# Patient Record
Sex: Female | Born: 1940 | Race: White | Hispanic: No | Smoking: Never smoker
Health system: Southern US, Community
[De-identification: ages and names within clinical notes are randomized; demographics above are authoritative.]

## PROBLEM LIST (undated history)

## (undated) DIAGNOSIS — M109 Gout, unspecified: Secondary | ICD-10-CM

## (undated) DIAGNOSIS — I10 Essential (primary) hypertension: Secondary | ICD-10-CM

## (undated) DIAGNOSIS — M545 Low back pain, unspecified: Secondary | ICD-10-CM

## (undated) DIAGNOSIS — T4145XA Adverse effect of unspecified anesthetic, initial encounter: Secondary | ICD-10-CM

## (undated) DIAGNOSIS — T8859XA Other complications of anesthesia, initial encounter: Secondary | ICD-10-CM

## (undated) DIAGNOSIS — E039 Hypothyroidism, unspecified: Secondary | ICD-10-CM

## (undated) DIAGNOSIS — M1990 Unspecified osteoarthritis, unspecified site: Secondary | ICD-10-CM

## (undated) DIAGNOSIS — G8929 Other chronic pain: Secondary | ICD-10-CM

## (undated) DIAGNOSIS — R7989 Other specified abnormal findings of blood chemistry: Secondary | ICD-10-CM

## (undated) DIAGNOSIS — R569 Unspecified convulsions: Secondary | ICD-10-CM

## (undated) DIAGNOSIS — K219 Gastro-esophageal reflux disease without esophagitis: Secondary | ICD-10-CM

## (undated) DIAGNOSIS — B029 Zoster without complications: Secondary | ICD-10-CM

## (undated) DIAGNOSIS — L03031 Cellulitis of right toe: Secondary | ICD-10-CM

## (undated) DIAGNOSIS — G40909 Epilepsy, unspecified, not intractable, without status epilepticus: Secondary | ICD-10-CM

## (undated) DIAGNOSIS — R945 Abnormal results of liver function studies: Secondary | ICD-10-CM

## (undated) DIAGNOSIS — I350 Nonrheumatic aortic (valve) stenosis: Secondary | ICD-10-CM

## (undated) DIAGNOSIS — I639 Cerebral infarction, unspecified: Secondary | ICD-10-CM

## (undated) DIAGNOSIS — E559 Vitamin D deficiency, unspecified: Secondary | ICD-10-CM

## (undated) HISTORY — DX: Zoster without complications: B02.9

## (undated) HISTORY — DX: Nonrheumatic aortic (valve) stenosis: I35.0

## (undated) HISTORY — DX: Vitamin D deficiency, unspecified: E55.9

## (undated) HISTORY — DX: Cellulitis of right toe: L03.031

## (undated) HISTORY — DX: Essential (primary) hypertension: I10

## (undated) HISTORY — DX: Gastro-esophageal reflux disease without esophagitis: K21.9

## (undated) HISTORY — DX: Other specified abnormal findings of blood chemistry: R79.89

## (undated) HISTORY — DX: Hypothyroidism, unspecified: E03.9

## (undated) HISTORY — DX: Gout, unspecified: M10.9

## (undated) HISTORY — DX: Abnormal results of liver function studies: R94.5

## (undated) HISTORY — DX: Epilepsy, unspecified, not intractable, without status epilepticus: G40.909

---

## 1968-09-30 HISTORY — PX: TUBAL LIGATION: SHX77

## 2001-10-31 ENCOUNTER — Encounter: Payer: Self-pay | Admitting: Emergency Medicine

## 2001-10-31 ENCOUNTER — Emergency Department (HOSPITAL_COMMUNITY): Admission: EM | Admit: 2001-10-31 | Discharge: 2001-10-31 | Payer: Self-pay | Admitting: Emergency Medicine

## 2003-03-12 ENCOUNTER — Encounter: Payer: Self-pay | Admitting: *Deleted

## 2003-03-12 ENCOUNTER — Ambulatory Visit (HOSPITAL_COMMUNITY): Admission: RE | Admit: 2003-03-12 | Discharge: 2003-03-12 | Payer: Self-pay | Admitting: *Deleted

## 2003-10-11 ENCOUNTER — Other Ambulatory Visit: Admission: RE | Admit: 2003-10-11 | Discharge: 2003-10-11 | Payer: Self-pay | Admitting: Family Medicine

## 2003-11-29 HISTORY — PX: LAPAROSCOPIC CHOLECYSTECTOMY: SUR755

## 2004-07-03 ENCOUNTER — Inpatient Hospital Stay (HOSPITAL_COMMUNITY): Admission: EM | Admit: 2004-07-03 | Discharge: 2004-07-10 | Payer: Self-pay | Admitting: Emergency Medicine

## 2004-07-03 ENCOUNTER — Encounter (INDEPENDENT_AMBULATORY_CARE_PROVIDER_SITE_OTHER): Payer: Self-pay | Admitting: Specialist

## 2007-03-16 ENCOUNTER — Other Ambulatory Visit: Admission: RE | Admit: 2007-03-16 | Discharge: 2007-03-16 | Payer: Self-pay | Admitting: Internal Medicine

## 2009-02-28 DIAGNOSIS — B029 Zoster without complications: Secondary | ICD-10-CM

## 2009-02-28 HISTORY — DX: Zoster without complications: B02.9

## 2009-12-07 ENCOUNTER — Other Ambulatory Visit: Admission: RE | Admit: 2009-12-07 | Discharge: 2009-12-07 | Payer: Self-pay | Admitting: Internal Medicine

## 2010-10-20 ENCOUNTER — Encounter: Payer: Self-pay | Admitting: Family Medicine

## 2011-02-15 NOTE — H&P (Signed)
NAMESAMELLA, LUCCHETTI              ACCOUNT NO.:  [id number]   MEDICAL RECORD NO.:  [id number]          PATIENT TYPE:  EMS   LOCATION:  MAJO                         FACILITY:  [facility]   PHYSICIAN:  Abigail Miyamoto, M.D. DATE OF BIRTH:  01-01-1941   DATE OF ADMISSION:  07/02/2004  DATE OF DISCHARGE:                                HISTORY & PHYSICAL   CHIEF COMPLAINT:  Abdominal pain.   HISTORY:  This is a 70 year old female who I have been asked to evaluate for  abdominal pain.  The patient currently is heavily sedated and minimally  responsive from the medication she had been given in the emergency  department prior to my arrival to see her but according to the nurses she  was talking and conversive prior to the medication. Secondary to this I have  been unable to get any kind of history from the patient, and her family has  already left the emergency department and gone home.  The ER physician who  had been seeing the patient has gone home as well.  According to the notes  on the computer, the patient was having right side abdominal pain earlier  today and presented to her primary care physician and was sent here.  She  apparently has had no nausea and vomiting but then developed some after  being given oral contrast for a CAT scan.  Apparently the pain was diffuse  but mostly in the right upper quadrant.   PAST MEDICAL HISTORY:  Apparently significant for seizure disorder.   PAST SURGICAL HISTORY:  She has had some kind of lower abdominal incision.  This is a midline incision.   MEDICATIONS:  Apparently include Depakote.   ALLERGIES:  She is apparently allergic to PENICILLIN, but again I cannot say  what that allergy or reaction is.   SOCIAL HISTORY:  Unobtainable secondary to the patient's sedated state.   REVIEW OF SYSTEMS:  Unobtainable secondary to the patient's sedated state.   PHYSICAL EXAMINATION:  GENERAL:  An obese female lying in bed, minimally  responsive but does  arouse to sternal rub and will talk, and then becomes  unconscious quickly again.  VITAL SIGNS:  Temperature is 98/6, pulse is 82, blood pressure is 128/87,  respiratory rate is 28.  HEENT:  Eyes are anicteric.  LUNGS:  Clear to auscultation bilaterally, normal respiratory effort.  CARDIOVASCULAR:  Regular rate and rhythm, there is a systolic ejection  murmur.  ABDOMEN:  Soft, it is tender with guarding in the right upper quadrant,  there is a well healed lower midline incision without hernia.   LABORATORY DATA:  The patient has normal liver function test with a  bilirubin of 0.7, alkaline phosphatase 67.  White blood count is 11.2  thousand, hemoglobin is 14.0, there is no left shift. Amylase is 48.  Urinalysis shows the patient to have 11-20 white blood cell count's and 11-  20 rbc's, and many bacteria.  BUN and creatinine are 19 and 1.3, potassium  4.1.   The patient had a CAT scan of the abdomen and pelvis which shows her abdomen  is  distended, a thick walled gallbladder with a lot of paracolic cystic  inflammation; the rest of the abdomen is unremarkable CAT scan.   IMPRESSION AND PLAN:  This is a patient with possible cholecystitis. Again,  I have not been able to do an adequate physical examination and history  taking secondary to her sedated state from the medications that were ordered  by the ER physician.  At this point we will admit her to the hospital for IV  antibiotics and once she is more awake will hopefully be able to obtain a  better history and physical examination, given the CAT scan that does appear  to be consistent with cholecystitis, although I am sure how the urinalysis  relates to this. We may be proceeding with cholecystectomy today although we  may order a HIDA scan and ultrasound prior to this.       DB/MEDQ  D:  07/03/2004  T:  07/03/2004  Job:  [id number]

## 2011-02-15 NOTE — Discharge Summary (Signed)
Dana Harrington, Dana Harrington              ACCOUNT NO.:  [id number]   MEDICAL RECORD NO.:  [id number]          PATIENT TYPE:  INP   LOCATION:  5733                         FACILITY:  [facility]   PHYSICIAN:  Adolph Pollack, M.D.DATE OF BIRTH:  Dec 25, 1940   DATE OF ADMISSION:  07/02/2004  DATE OF DISCHARGE:  07/10/2004                                 DISCHARGE SUMMARY   PRINCIPAL DISCHARGE DIAGNOSIS:  Acute cholecystitis.   SECONDARY DIAGNOSES:  1.  Seizure disorder.  2.  Metabolic encephalopathy secondary to narcotics.  3.  Acute renal insufficiency secondary to acute tubular necrosis.   PROCEDURE:  Laparoscopic cholecystectomy, July 03, 2004.   REASON FOR ADMISSION:  This 70 year old female was seen by Dr. Abigail Miyamoto in the emergency department, who had right-sided abdominal pain.  She was doing some narcotics and was heavily sedated.  On examination, she  had some right upper quadrant tenderness and guarding.  She had a mild  leukocytosis.  She had a urinalysis suggesting a possible urinary tract  infection.  She subsequently was admitted.   She was admitted, placed on IV antibiotics, and taken to the operating room  where she underwent the above procedure.  Postoperatively, her seizure  medications were restarted, but she had some difficulty with a depressed  mental status since receiving narcotics in the emergency department.  She  was stable from a surgical standpoint.  I went ahead and put a PICC line in  her and asked [facility] to see her.  They suggested switching her  antibiotics to cover a possible aspiration pneumonia.  Over the subsequent  days, she very slowly improved.  She had coverage of her UTI and the  possible pneumonia.  She had a drainage tube, which was draining a small  amount of serous fluid.  By the 9th, she was more awake and alert and could  have her Foley removed.  Her drain was removed on July 09, 2004.  Her  mental status improved  dramatically, and by July 10, 2004, she was ready  for discharge.   DISPOSITION:  Discharged home in satisfactory condition, July 10, 2004.  She will follow up with Dr. Idell Pickles in one week.  After discussion with  Internal Medicine, it was decided just to go ahead and stop her antibiotics.  She was told not to take any narcotics for pain, and she was going to see me  back in the office in about two weeks for followup.      Tish Men  D:  08/27/2004  T:  08/27/2004  Job:  [id number]

## 2011-02-15 NOTE — Op Note (Signed)
NAMEMCKINLEE, DUNK              ACCOUNT NO.:  [id number]   MEDICAL RECORD NO.:  [id number]          PATIENT TYPE:  INP   LOCATION:  5733                         FACILITY:  [facility]   PHYSICIAN:  Adolph Pollack, M.D.DATE OF BIRTH:  [birth year]   DATE OF PROCEDURE:  DATE OF DISCHARGE:                                 OPERATIVE REPORT   DATE OF OPERATION:  July 03, 2004.   PREOPERATIVE DIAGNOSIS:  Acute cholecystitis.   POSTOPERATIVE DIAGNOSIS:  Acute cholecystitis.   PROCEDURE:  Laparoscopic cholecystectomy.   SURGEON:  Adolph Pollack, MD.   ASSISTANT:  Gita Kudo, MD.   ANESTHESIA:  General.   DRAIN:  #19 Blake, round.   INDICATIONS:  This is a 70 year old female admitted by Dr. Abigail Miyamoto,  who had acute cholecystitis.  This was by physical examination and a CT  scan.  She now presents for laparoscopic cholecystectomy.  The procedure and  the risks were discussed with her preoperatively.  I explained the procedure  and risks as well to her daughter.  They seemed to understand everything and  were agreeable to proceeding as planned.  Of note was that all of her liver  function tests were within normal limits as well as her amylase and lipase.   TECHNIQUE:  She is brought to the operating room, placed supine on the  operating table, and a general anesthetic was administered.  The abdominal  wall was sterilely prepped and draped.  A one-quarter percent Marcaine  solution was infiltrated in the subumbilical region, and then a small  subumbilical incision was made through the skin, subcutaneous tissue,  fascia, and peritoneum under direct vision, entering the peritoneal cavity  under direct vision.  A pursestring suture of 0 Vicryl was placed around the  fascial edges.  A Hassan trocar was introduced into the peritoneal cavity,  and a pneumoperitoneum created by insufflation of CO2 gas.   Next, a laparoscope was introduced, and omental adhesions to the  gallbladder  region were noted.  The patient was placed in the reverse Trendelenburg  position with the right side tilted slightly upward.  Following this, an 11-  mm trocar was placed through an epigastric incision, and two 5-mm trocars  were placed in the right mid abdomen.   Using blunt dissection, the omentum was swept free from a distended, acutely  inflamed gallbladder.  Decompression was performed.  The fundus was then  grasped and retracted toward the right shoulder.  Using blunt dissection, I  was able to identify the cystic artery as the gallbladder tapered down into  the neck and foramen.  I then identified the cystic duct.  I created a  window around the cystic artery and cystic duct.  The cystic artery was  clipped and divided.  The cystic duct was very small measuring 3 to 4 mm at  most by my estimation, and the patient had large gallstones, and so with  normal liver function tests and with what I felt was adequate anatomy, I  clipped the cystic duct three times proximally, once distally, and then  divided it.  The gallbladder was then dissected free from the liver bed.  I  did make a hole in the gallbladder with some leakage of bile and the two  large stones, which measured at least 1.5 to 2 cm.  Once the gallbladder was  removed, it was placed in an Endopouch bag, and the two stones were  retrieved and also placed in an Endopouch bag.  I then copiously irrigated  out the gallbladder fossa and controlled the bleeding points with cautery.  I evacuated as much irrigation fluid as possible, using about two liters of  irrigation.  I then placed a drain into the gallbladder fossa and brought it  out through the lateral 5 mm trocar site and anchored it to the skin with a  3-0 nylon suture.  Following this, I grasped the Endopouch bag and removed  the gallbladder and two gallstones through the subumbilical port.  The  subumbilical port fascial defect was closed by tightening up and  tightening  down the pursestring suture.  The remaining trocars were removed, and the  pneumoperitoneum was released.  The three remaining trocar skin incisions  were then closed with 4-0 Monocryl subcuticular stitches.  Steri-Strips and  sterile dressing were applied.   She tolerated the procedure without any apparent complications and was taken  to the recovery room in satisfactory condition.  I attempted to go find her  daughter or family to talk to, but could not find anybody in the waiting  area at the time and no one was upstairs in her room.       TJR/MEDQ  D:  07/03/2004  T:  07/03/2004  Job:  [id number]   cc:   [facility]

## 2013-06-08 ENCOUNTER — Encounter: Payer: Self-pay | Admitting: Gastroenterology

## 2013-06-24 ENCOUNTER — Encounter: Payer: Self-pay | Admitting: Gastroenterology

## 2013-07-15 ENCOUNTER — Ambulatory Visit: Payer: Self-pay | Admitting: Gastroenterology

## 2014-12-08 DIAGNOSIS — E039 Hypothyroidism, unspecified: Secondary | ICD-10-CM | POA: Diagnosis not present

## 2014-12-08 DIAGNOSIS — M549 Dorsalgia, unspecified: Secondary | ICD-10-CM | POA: Diagnosis not present

## 2014-12-08 DIAGNOSIS — E559 Vitamin D deficiency, unspecified: Secondary | ICD-10-CM | POA: Diagnosis not present

## 2014-12-08 DIAGNOSIS — I10 Essential (primary) hypertension: Secondary | ICD-10-CM | POA: Diagnosis not present

## 2015-01-05 DIAGNOSIS — E559 Vitamin D deficiency, unspecified: Secondary | ICD-10-CM | POA: Diagnosis not present

## 2015-01-05 DIAGNOSIS — E039 Hypothyroidism, unspecified: Secondary | ICD-10-CM | POA: Diagnosis not present

## 2015-01-05 DIAGNOSIS — M810 Age-related osteoporosis without current pathological fracture: Secondary | ICD-10-CM | POA: Diagnosis not present

## 2015-01-05 DIAGNOSIS — I10 Essential (primary) hypertension: Secondary | ICD-10-CM | POA: Diagnosis not present

## 2015-01-05 DIAGNOSIS — G40901 Epilepsy, unspecified, not intractable, with status epilepticus: Secondary | ICD-10-CM | POA: Diagnosis not present

## 2015-01-05 DIAGNOSIS — M109 Gout, unspecified: Secondary | ICD-10-CM | POA: Diagnosis not present

## 2015-01-10 DIAGNOSIS — M549 Dorsalgia, unspecified: Secondary | ICD-10-CM | POA: Diagnosis not present

## 2015-01-10 DIAGNOSIS — E039 Hypothyroidism, unspecified: Secondary | ICD-10-CM | POA: Diagnosis not present

## 2015-01-10 DIAGNOSIS — I10 Essential (primary) hypertension: Secondary | ICD-10-CM | POA: Diagnosis not present

## 2015-01-10 DIAGNOSIS — G629 Polyneuropathy, unspecified: Secondary | ICD-10-CM | POA: Diagnosis not present

## 2015-01-24 DIAGNOSIS — Z79899 Other long term (current) drug therapy: Secondary | ICD-10-CM | POA: Diagnosis not present

## 2015-01-24 DIAGNOSIS — M79606 Pain in leg, unspecified: Secondary | ICD-10-CM | POA: Diagnosis not present

## 2015-01-24 DIAGNOSIS — G894 Chronic pain syndrome: Secondary | ICD-10-CM | POA: Diagnosis not present

## 2015-01-24 DIAGNOSIS — M545 Low back pain: Secondary | ICD-10-CM | POA: Diagnosis not present

## 2015-02-07 DIAGNOSIS — M545 Low back pain: Secondary | ICD-10-CM | POA: Diagnosis not present

## 2015-04-11 DIAGNOSIS — E559 Vitamin D deficiency, unspecified: Secondary | ICD-10-CM | POA: Diagnosis not present

## 2015-04-11 DIAGNOSIS — E039 Hypothyroidism, unspecified: Secondary | ICD-10-CM | POA: Diagnosis not present

## 2015-04-11 DIAGNOSIS — I10 Essential (primary) hypertension: Secondary | ICD-10-CM | POA: Diagnosis not present

## 2015-04-18 DIAGNOSIS — R569 Unspecified convulsions: Secondary | ICD-10-CM | POA: Diagnosis not present

## 2015-04-18 DIAGNOSIS — I10 Essential (primary) hypertension: Secondary | ICD-10-CM | POA: Diagnosis not present

## 2015-04-18 DIAGNOSIS — E039 Hypothyroidism, unspecified: Secondary | ICD-10-CM | POA: Diagnosis not present

## 2015-04-18 DIAGNOSIS — M549 Dorsalgia, unspecified: Secondary | ICD-10-CM | POA: Diagnosis not present

## 2015-07-11 DIAGNOSIS — R634 Abnormal weight loss: Secondary | ICD-10-CM | POA: Diagnosis not present

## 2015-07-11 DIAGNOSIS — R630 Anorexia: Secondary | ICD-10-CM | POA: Diagnosis not present

## 2015-07-11 DIAGNOSIS — R1013 Epigastric pain: Secondary | ICD-10-CM | POA: Diagnosis not present

## 2015-07-11 DIAGNOSIS — N390 Urinary tract infection, site not specified: Secondary | ICD-10-CM | POA: Diagnosis not present

## 2015-07-11 DIAGNOSIS — G40909 Epilepsy, unspecified, not intractable, without status epilepticus: Secondary | ICD-10-CM | POA: Diagnosis not present

## 2015-07-12 ENCOUNTER — Telehealth: Payer: Self-pay | Admitting: Gastroenterology

## 2015-07-12 NOTE — Telephone Encounter (Signed)
NO GI hx per [facility] of the day Call back# to [facility] @ Dr. Shelia Media [phone]

## 2015-07-12 NOTE — Telephone Encounter (Signed)
Patient scheduled to see Dr. Carlean Purl on 07/26/15 2:45.  Pat notified and new patient letter mailed to the patient

## 2015-07-17 ENCOUNTER — Emergency Department (HOSPITAL_COMMUNITY)
Admission: EM | Admit: 2015-07-17 | Discharge: 2015-07-18 | Disposition: A | Discharge status: Home / Self Care | Payer: Medicare Other | Attending: Emergency Medicine | Admitting: Emergency Medicine

## 2015-07-17 ENCOUNTER — Encounter (HOSPITAL_COMMUNITY): Payer: Self-pay | Admitting: *Deleted

## 2015-07-17 DIAGNOSIS — E039 Hypothyroidism, unspecified: Secondary | ICD-10-CM | POA: Diagnosis not present

## 2015-07-17 DIAGNOSIS — R05 Cough: Secondary | ICD-10-CM | POA: Diagnosis not present

## 2015-07-17 DIAGNOSIS — Z79899 Other long term (current) drug therapy: Secondary | ICD-10-CM | POA: Insufficient documentation

## 2015-07-17 DIAGNOSIS — J069 Acute upper respiratory infection, unspecified: Secondary | ICD-10-CM | POA: Diagnosis not present

## 2015-07-17 DIAGNOSIS — Z872 Personal history of diseases of the skin and subcutaneous tissue: Secondary | ICD-10-CM | POA: Diagnosis not present

## 2015-07-17 DIAGNOSIS — B9789 Other viral agents as the cause of diseases classified elsewhere: Secondary | ICD-10-CM

## 2015-07-17 DIAGNOSIS — I10 Essential (primary) hypertension: Secondary | ICD-10-CM | POA: Diagnosis not present

## 2015-07-17 DIAGNOSIS — M109 Gout, unspecified: Secondary | ICD-10-CM | POA: Insufficient documentation

## 2015-07-17 DIAGNOSIS — J9811 Atelectasis: Secondary | ICD-10-CM | POA: Diagnosis not present

## 2015-07-17 DIAGNOSIS — K219 Gastro-esophageal reflux disease without esophagitis: Secondary | ICD-10-CM | POA: Insufficient documentation

## 2015-07-17 DIAGNOSIS — R6883 Chills (without fever): Secondary | ICD-10-CM | POA: Diagnosis not present

## 2015-07-17 DIAGNOSIS — R52 Pain, unspecified: Secondary | ICD-10-CM | POA: Diagnosis present

## 2015-07-17 DIAGNOSIS — M791 Myalgia: Secondary | ICD-10-CM | POA: Diagnosis not present

## 2015-07-17 DIAGNOSIS — Z880 Allergy status to penicillin: Secondary | ICD-10-CM | POA: Diagnosis not present

## 2015-07-17 DIAGNOSIS — R630 Anorexia: Secondary | ICD-10-CM | POA: Insufficient documentation

## 2015-07-17 DIAGNOSIS — J988 Other specified respiratory disorders: Secondary | ICD-10-CM

## 2015-07-17 LAB — CBC
HCT: 43.8 % (ref 36.0–46.0)
Hemoglobin: 13.5 g/dL (ref 12.0–15.0)
MCH: 29.5 pg (ref 26.0–34.0)
MCHC: 30.8 g/dL (ref 30.0–36.0)
MCV: 95.8 fL (ref 78.0–100.0)
Platelets: 111 10*3/uL — ABNORMAL LOW (ref 150–400)
RBC: 4.57 MIL/uL (ref 3.87–5.11)
RDW: 13.8 % (ref 11.5–15.5)
WBC: 13.1 10*3/uL — ABNORMAL HIGH (ref 4.0–10.5)

## 2015-07-17 LAB — BASIC METABOLIC PANEL
Anion gap: 6 (ref 5–15)
BUN: 33 mg/dL — ABNORMAL HIGH (ref 6–20)
CO2: 27 mmol/L (ref 22–32)
Calcium: 9 mg/dL (ref 8.9–10.3)
Chloride: 105 mmol/L (ref 101–111)
Creatinine, Ser: 1.48 mg/dL — ABNORMAL HIGH (ref 0.44–1.00)
GFR calc Af Amer: 39 mL/min — ABNORMAL LOW (ref >60)
GFR calc non Af Amer: 33 mL/min — ABNORMAL LOW (ref >60)
Glucose, Bld: 178 mg/dL — ABNORMAL HIGH (ref 65–99)
Potassium: 4.4 mmol/L (ref 3.5–5.1)
Sodium: 138 mmol/L (ref 135–145)

## 2015-07-17 NOTE — ED Notes (Signed)
Pt's daughter/care taker reports pt has been c/o generalized body aches and weakness since Sunday.  Denies any fever at this time.

## 2015-07-18 ENCOUNTER — Emergency Department (HOSPITAL_COMMUNITY): Payer: Medicare Other

## 2015-07-18 DIAGNOSIS — J9811 Atelectasis: Secondary | ICD-10-CM | POA: Diagnosis not present

## 2015-07-18 LAB — URINE MICROSCOPIC-ADD ON

## 2015-07-18 LAB — URINALYSIS, ROUTINE W REFLEX MICROSCOPIC
Bilirubin Urine: NEGATIVE
Glucose, UA: NEGATIVE mg/dL
Ketones, ur: NEGATIVE mg/dL
Nitrite: NEGATIVE
Protein, ur: NEGATIVE mg/dL
Specific Gravity, Urine: 1.026 (ref 1.005–1.030)
Urobilinogen, UA: 0.2 mg/dL (ref 0.0–1.0)
pH: 6 (ref 5.0–8.0)

## 2015-07-18 LAB — DIFFERENTIAL
Basophils Absolute: 0 10*3/uL (ref 0.0–0.1)
Basophils Relative: 0 %
Eosinophils Absolute: 0.3 10*3/uL (ref 0.0–0.7)
Eosinophils Relative: 2 %
Lymphocytes Relative: 16 %
Lymphs Abs: 2.1 10*3/uL (ref 0.7–4.0)
Monocytes Absolute: 1.8 10*3/uL — ABNORMAL HIGH (ref 0.1–1.0)
Monocytes Relative: 14 %
Neutro Abs: 9 10*3/uL — ABNORMAL HIGH (ref 1.7–7.7)
Neutrophils Relative %: 68 %

## 2015-07-18 MED ORDER — SODIUM CHLORIDE 0.9 % IV BOLUS (SEPSIS)
500.0000 mL | Freq: Once | INTRAVENOUS | Status: AC
  Administered 2015-07-18: 500 mL via INTRAVENOUS

## 2015-07-18 NOTE — ED Notes (Signed)
Patient transported to X-ray 

## 2015-07-18 NOTE — ED Provider Notes (Signed)
CSN: [id number]     Arrival date & time 07/17/15  1951 History   First MD Initiated Contact with Patient 07/17/15 2342     Chief Complaint  Patient presents with  . Generalized Body Aches     (Consider location/radiation/quality/duration/timing/severity/associated sxs/prior Treatment) HPI Comments: Patient with a history of HTN, hypothyroid, AS, seizure disorder presents with generalized body aches for the past 2 days, worse today. She has a sore throat and a cough that is non-productive. No known fever but she reports chills. She has no appetite and has been drinking very little. She denies nausea, vomiting, urinary symptoms, diarrhea. No sick contacts.   The history is provided by the patient and a relative. No language interpreter was used.    Past Medical History  Diagnosis Date  . Hypertension   . Gout   . GERD (gastroesophageal reflux disease)   . Hypothyroidism   . Seizure disorder ([place])   . LFT elevation   . Paronychia of fourth toe of right foot   . Aortic stenosis   . Vitamin D deficiency    Past Surgical History  Procedure Laterality Date  . Laparoscopic cholecystectomy  11/2003  . Tubal ligation  1970   Family History  Problem Relation Age of Onset  . Heart disease Father   . Seizures Mother   . Dementia Brother   . Sleep disorder Sister    Social History  Substance Use Topics  . Smoking status: Never Smoker   . Smokeless tobacco: None  . Alcohol Use: No   OB History    No data available     Review of Systems  Constitutional: Positive for chills and appetite change. Negative for fever.  HENT: Positive for sore throat.   Respiratory: Positive for cough. Negative for shortness of breath.   Cardiovascular: Negative.  Negative for chest pain.  Gastrointestinal: Negative.  Negative for nausea, vomiting and abdominal pain.  Genitourinary: Negative.  Negative for dysuria and frequency.  Musculoskeletal: Positive for myalgias. Negative for neck stiffness.   Skin: Negative.  Negative for rash.  Neurological: Negative.       Allergies  Gabapentin and Penicillins  Home Medications   Prior to Admission medications   Medication Sig Start Date End Date Taking? Authorizing Provider  acetaminophen (TYLENOL) 325 MG tablet Take 325 mg by mouth every 6 (six) hours as needed for pain.   Yes Historical Provider, MD  carvedilol (COREG) 12.5 MG tablet Take 12.5 mg by mouth 2 (two) times daily with a meal.   Yes Historical Provider, MD  divalproex (DEPAKOTE) 500 MG DR tablet Take 500 mg by mouth 2 (two) times daily.   Yes Historical Provider, MD  febuxostat (ULORIC) 40 MG tablet Take 40 mg by mouth daily.   Yes Historical Provider, MD  furosemide (LASIX) 20 MG tablet Take 20 mg by mouth as needed for fluid.    Yes Historical Provider, MD  HYDROcodone-acetaminophen (NORCO/VICODIN) 5-325 MG per tablet Take 1 tablet by mouth 2 (two) times daily as needed for pain.   Yes Historical Provider, MD  ibuprofen (ADVIL,MOTRIN) 200 MG tablet Take 200 mg by mouth as needed for pain.   Yes Historical Provider, MD  levothyroxine (SYNTHROID, LEVOTHROID) 75 MCG tablet Take 75 mcg by mouth daily before breakfast.   Yes Historical Provider, MD  omeprazole (PRILOSEC) 40 MG capsule Take 1 capsule by mouth 2 (two) times daily.  07/11/15  Yes Historical Provider, MD  Phenylephrine-Pheniramine-DM (Lamoni) 07-20-19 MG PACK Take  1 tablet by mouth every 4 (four) hours.   Yes Historical Provider, MD  pravastatin (PRAVACHOL) 40 MG tablet Take 40 mg by mouth daily.   Yes Historical Provider, MD  sucralfate (CARAFATE) 1 G tablet Take 1 tablet by mouth 2 (two) times daily. 07/11/15  Yes Historical Provider, MD  zonisamide (ZONEGRAN) 100 MG capsule Take 100 mg by mouth 2 (two) times daily.    Yes Historical Provider, MD   BP 158/36 mmHg  Pulse 89  Resp 16  SpO2 93% Physical Exam  Constitutional: She is oriented to person, place, and time. She appears well-developed and  well-nourished.  HENT:  Head: Normocephalic.  Mouth/Throat: Oropharynx is clear and moist.  Neck: Normal range of motion. Neck supple.  Cardiovascular: Normal rate and regular rhythm.   No murmur heard. Pulmonary/Chest: Effort normal and breath sounds normal. She has no wheezes. She has no rales.  Abdominal: Soft. Bowel sounds are normal. There is no tenderness. There is no rebound and no guarding.  Musculoskeletal: Normal range of motion.  Neurological: She is alert and oriented to person, place, and time.  Skin: Skin is warm and dry. No rash noted.  Psychiatric: She has a normal mood and affect.    ED Course  Procedures (including critical care time) Labs Review Labs Reviewed  BASIC METABOLIC PANEL - Abnormal; Notable for the following:    Glucose, Bld 178 (*)    BUN 33 (*)    Creatinine, Ser 1.48 (*)    GFR calc non Af Amer 33 (*)    GFR calc Af Amer 39 (*)    All other components within normal limits  CBC - Abnormal; Notable for the following:    WBC 13.1 (*)    Platelets 111 (*)    All other components within normal limits  DIFFERENTIAL - Abnormal; Notable for the following:    Neutro Abs 9.0 (*)    Monocytes Absolute 1.8 (*)    All other components within normal limits  URINALYSIS, ROUTINE W REFLEX MICROSCOPIC (NOT AT [facility])    Imaging Review No results found. I have personally reviewed and evaluated these images and lab results as part of my medical decision-making.   EKG Interpretation None      MDM   Final diagnoses:  None    1. Viral illness  The patient's lab studies are reassuring, CXR without PNA. She has a slightly elevated BUN/Cr - given IV fluids. She feels better with fluids. Daughter at bedside reports pending appointment with PCP which is appropriate for recheck. Discussed patient care with Dr. Randal Buba.     Charlann Lange, PA-C 07/18/15 [id number]  April Palumbo, MD 07/18/15 [phone]

## 2015-07-18 NOTE — ED Notes (Signed)
Daughter questioning when someone will see.  Spoke with Dr. Randal Buba & she is aware.  Pt informed Dr. Randal Buba making rounds.

## 2015-07-18 NOTE — Discharge Instructions (Signed)
Viral Infections °A viral infection can be caused by different types of viruses. Most viral infections are not serious and resolve on their own. However, some infections may cause severe symptoms and may lead to further complications. °SYMPTOMS °Viruses can frequently cause: °· Minor sore throat. °· Aches and pains. °· Headaches. °· Runny nose. °· Different types of rashes. °· Watery eyes. °· Tiredness. °· Cough. °· Loss of appetite. °· Gastrointestinal infections, resulting in nausea, vomiting, and diarrhea. °These symptoms do not respond to antibiotics because the infection is not caused by bacteria. However, you might catch a bacterial infection following the viral infection. This is sometimes called a "superinfection." Symptoms of such a bacterial infection may include: °· Worsening sore throat with pus and difficulty swallowing. °· Swollen neck glands. °· Chills and a high or persistent fever. °· Severe headache. °· Tenderness over the sinuses. °· Persistent overall ill feeling (malaise), muscle aches, and tiredness (fatigue). °· Persistent cough. °· Yellow, green, or brown mucus production with coughing. °HOME CARE INSTRUCTIONS  °· Only take over-the-counter or prescription medicines for pain, discomfort, diarrhea, or fever as directed by your caregiver. °· Drink enough water and fluids to keep your urine clear or pale yellow. Sports drinks can provide valuable electrolytes, sugars, and hydration. °· Get plenty of rest and maintain proper nutrition. Soups and broths with crackers or rice are fine. °SEEK IMMEDIATE MEDICAL CARE IF:  °· You have severe headaches, shortness of breath, chest pain, neck pain, or an unusual rash. °· You have uncontrolled vomiting, diarrhea, or you are unable to keep down fluids. °· You or your child has an oral temperature above 102° F (38.9° C), not controlled by medicine. °· Your baby is older than 3 months with a rectal temperature of 102° F (38.9° C) or higher. °· Your baby is 3  months old or younger with a rectal temperature of 100.4° F (38° C) or higher. °MAKE SURE YOU:  °· Understand these instructions. °· Will watch your condition. °· Will get help right away if you are not doing well or get worse. °  °This information is not intended to replace advice given to you by your health care provider. Make sure you discuss any questions you have with your health care provider. °  °Document Released: 06/26/2005 Document Revised: 12/09/2011 Document Reviewed: 02/22/2015 °Elsevier Interactive Patient Education ©2016 [employer] ° °

## 2015-07-19 LAB — URINE CULTURE

## 2015-07-25 DIAGNOSIS — I10 Essential (primary) hypertension: Secondary | ICD-10-CM | POA: Diagnosis not present

## 2015-07-25 DIAGNOSIS — E039 Hypothyroidism, unspecified: Secondary | ICD-10-CM | POA: Diagnosis not present

## 2015-07-25 DIAGNOSIS — E559 Vitamin D deficiency, unspecified: Secondary | ICD-10-CM | POA: Diagnosis not present

## 2015-07-25 DIAGNOSIS — M109 Gout, unspecified: Secondary | ICD-10-CM | POA: Diagnosis not present

## 2015-07-26 ENCOUNTER — Ambulatory Visit: Payer: Self-pay | Admitting: Internal Medicine

## 2015-07-27 ENCOUNTER — Telehealth: Payer: Self-pay

## 2015-07-27 NOTE — Telephone Encounter (Signed)
Left message for patient to call us back and r/s her appointment.  Also called [facility] and left the referral person a message that they no-showed.  Filed her notes back into PJ's folder.

## 2015-08-08 DIAGNOSIS — I10 Essential (primary) hypertension: Secondary | ICD-10-CM | POA: Diagnosis not present

## 2015-09-20 DIAGNOSIS — M109 Gout, unspecified: Secondary | ICD-10-CM | POA: Diagnosis not present

## 2015-09-20 DIAGNOSIS — I10 Essential (primary) hypertension: Secondary | ICD-10-CM | POA: Diagnosis not present

## 2015-09-20 DIAGNOSIS — E039 Hypothyroidism, unspecified: Secondary | ICD-10-CM | POA: Diagnosis not present

## 2015-09-20 DIAGNOSIS — E559 Vitamin D deficiency, unspecified: Secondary | ICD-10-CM | POA: Diagnosis not present

## 2015-09-20 DIAGNOSIS — G40909 Epilepsy, unspecified, not intractable, without status epilepticus: Secondary | ICD-10-CM | POA: Diagnosis not present

## 2015-10-03 DIAGNOSIS — M109 Gout, unspecified: Secondary | ICD-10-CM | POA: Diagnosis not present

## 2015-10-03 DIAGNOSIS — E039 Hypothyroidism, unspecified: Secondary | ICD-10-CM | POA: Diagnosis not present

## 2015-10-03 DIAGNOSIS — I10 Essential (primary) hypertension: Secondary | ICD-10-CM | POA: Diagnosis not present

## 2015-10-03 DIAGNOSIS — E559 Vitamin D deficiency, unspecified: Secondary | ICD-10-CM | POA: Diagnosis not present

## 2015-11-02 ENCOUNTER — Ambulatory Visit: Payer: Self-pay | Admitting: Internal Medicine

## 2015-12-27 DIAGNOSIS — G40909 Epilepsy, unspecified, not intractable, without status epilepticus: Secondary | ICD-10-CM | POA: Diagnosis not present

## 2015-12-27 DIAGNOSIS — E039 Hypothyroidism, unspecified: Secondary | ICD-10-CM | POA: Diagnosis not present

## 2015-12-27 DIAGNOSIS — M109 Gout, unspecified: Secondary | ICD-10-CM | POA: Diagnosis not present

## 2015-12-27 DIAGNOSIS — E559 Vitamin D deficiency, unspecified: Secondary | ICD-10-CM | POA: Diagnosis not present

## 2015-12-27 DIAGNOSIS — I10 Essential (primary) hypertension: Secondary | ICD-10-CM | POA: Diagnosis not present

## 2016-01-02 DIAGNOSIS — I10 Essential (primary) hypertension: Secondary | ICD-10-CM | POA: Diagnosis not present

## 2016-01-02 DIAGNOSIS — E7800 Pure hypercholesterolemia, unspecified: Secondary | ICD-10-CM | POA: Diagnosis not present

## 2016-01-02 DIAGNOSIS — E559 Vitamin D deficiency, unspecified: Secondary | ICD-10-CM | POA: Diagnosis not present

## 2016-01-02 DIAGNOSIS — E039 Hypothyroidism, unspecified: Secondary | ICD-10-CM | POA: Diagnosis not present

## 2016-03-28 DIAGNOSIS — E039 Hypothyroidism, unspecified: Secondary | ICD-10-CM | POA: Diagnosis not present

## 2016-03-28 DIAGNOSIS — G40909 Epilepsy, unspecified, not intractable, without status epilepticus: Secondary | ICD-10-CM | POA: Diagnosis not present

## 2016-03-28 DIAGNOSIS — I10 Essential (primary) hypertension: Secondary | ICD-10-CM | POA: Diagnosis not present

## 2016-04-04 DIAGNOSIS — R739 Hyperglycemia, unspecified: Secondary | ICD-10-CM | POA: Diagnosis not present

## 2016-04-04 DIAGNOSIS — E039 Hypothyroidism, unspecified: Secondary | ICD-10-CM | POA: Diagnosis not present

## 2016-06-05 DIAGNOSIS — E039 Hypothyroidism, unspecified: Secondary | ICD-10-CM | POA: Diagnosis not present

## 2016-06-05 DIAGNOSIS — R739 Hyperglycemia, unspecified: Secondary | ICD-10-CM | POA: Diagnosis not present

## 2016-06-12 DIAGNOSIS — I10 Essential (primary) hypertension: Secondary | ICD-10-CM | POA: Diagnosis not present

## 2016-06-12 DIAGNOSIS — M109 Gout, unspecified: Secondary | ICD-10-CM | POA: Diagnosis not present

## 2016-06-12 DIAGNOSIS — E039 Hypothyroidism, unspecified: Secondary | ICD-10-CM | POA: Diagnosis not present

## 2016-06-12 DIAGNOSIS — E559 Vitamin D deficiency, unspecified: Secondary | ICD-10-CM | POA: Diagnosis not present

## 2016-07-04 DIAGNOSIS — R300 Dysuria: Secondary | ICD-10-CM | POA: Diagnosis not present

## 2016-07-04 DIAGNOSIS — N390 Urinary tract infection, site not specified: Secondary | ICD-10-CM | POA: Diagnosis not present

## 2016-10-03 DIAGNOSIS — E559 Vitamin D deficiency, unspecified: Secondary | ICD-10-CM | POA: Diagnosis not present

## 2016-10-03 DIAGNOSIS — M109 Gout, unspecified: Secondary | ICD-10-CM | POA: Diagnosis not present

## 2016-10-03 DIAGNOSIS — I10 Essential (primary) hypertension: Secondary | ICD-10-CM | POA: Diagnosis not present

## 2016-10-03 DIAGNOSIS — E039 Hypothyroidism, unspecified: Secondary | ICD-10-CM | POA: Diagnosis not present

## 2016-10-10 DIAGNOSIS — E559 Vitamin D deficiency, unspecified: Secondary | ICD-10-CM | POA: Diagnosis not present

## 2016-10-10 DIAGNOSIS — M25561 Pain in right knee: Secondary | ICD-10-CM | POA: Diagnosis not present

## 2016-10-10 DIAGNOSIS — E039 Hypothyroidism, unspecified: Secondary | ICD-10-CM | POA: Diagnosis not present

## 2016-11-12 DIAGNOSIS — D37030 Neoplasm of uncertain behavior of the parotid salivary glands: Secondary | ICD-10-CM | POA: Diagnosis not present

## 2016-11-13 DIAGNOSIS — D110 Benign neoplasm of parotid gland: Secondary | ICD-10-CM | POA: Diagnosis not present

## 2016-12-19 ENCOUNTER — Encounter (HOSPITAL_COMMUNITY): Payer: Self-pay | Admitting: Emergency Medicine

## 2016-12-19 ENCOUNTER — Inpatient Hospital Stay (HOSPITAL_COMMUNITY): Payer: Medicare Other

## 2016-12-19 ENCOUNTER — Inpatient Hospital Stay (HOSPITAL_COMMUNITY)
Admission: EM | Admit: 2016-12-19 | Discharge: 2016-12-21 | DRG: 065 | Disposition: A | Discharge status: Home Health | Payer: Medicare Other | Attending: Internal Medicine | Admitting: Internal Medicine

## 2016-12-19 ENCOUNTER — Emergency Department (HOSPITAL_COMMUNITY): Payer: Medicare Other

## 2016-12-19 DIAGNOSIS — R569 Unspecified convulsions: Secondary | ICD-10-CM

## 2016-12-19 DIAGNOSIS — R29810 Facial weakness: Secondary | ICD-10-CM | POA: Diagnosis present

## 2016-12-19 DIAGNOSIS — I459 Conduction disorder, unspecified: Secondary | ICD-10-CM | POA: Diagnosis not present

## 2016-12-19 DIAGNOSIS — M7918 Myalgia, other site: Secondary | ICD-10-CM

## 2016-12-19 DIAGNOSIS — G40909 Epilepsy, unspecified, not intractable, without status epilepticus: Secondary | ICD-10-CM | POA: Diagnosis present

## 2016-12-19 DIAGNOSIS — M109 Gout, unspecified: Secondary | ICD-10-CM | POA: Diagnosis not present

## 2016-12-19 DIAGNOSIS — R05 Cough: Secondary | ICD-10-CM | POA: Diagnosis not present

## 2016-12-19 DIAGNOSIS — I129 Hypertensive chronic kidney disease with stage 1 through stage 4 chronic kidney disease, or unspecified chronic kidney disease: Secondary | ICD-10-CM | POA: Diagnosis present

## 2016-12-19 DIAGNOSIS — K219 Gastro-esophageal reflux disease without esophagitis: Secondary | ICD-10-CM | POA: Diagnosis not present

## 2016-12-19 DIAGNOSIS — Z79899 Other long term (current) drug therapy: Secondary | ICD-10-CM | POA: Diagnosis not present

## 2016-12-19 DIAGNOSIS — E559 Vitamin D deficiency, unspecified: Secondary | ICD-10-CM | POA: Diagnosis not present

## 2016-12-19 DIAGNOSIS — I10 Essential (primary) hypertension: Secondary | ICD-10-CM | POA: Diagnosis not present

## 2016-12-19 DIAGNOSIS — E039 Hypothyroidism, unspecified: Secondary | ICD-10-CM

## 2016-12-19 DIAGNOSIS — I639 Cerebral infarction, unspecified: Secondary | ICD-10-CM | POA: Diagnosis not present

## 2016-12-19 DIAGNOSIS — N183 Chronic kidney disease, stage 3 (moderate): Secondary | ICD-10-CM | POA: Diagnosis not present

## 2016-12-19 DIAGNOSIS — M791 Myalgia: Secondary | ICD-10-CM

## 2016-12-19 DIAGNOSIS — Z8249 Family history of ischemic heart disease and other diseases of the circulatory system: Secondary | ICD-10-CM | POA: Diagnosis not present

## 2016-12-19 DIAGNOSIS — M170 Bilateral primary osteoarthritis of knee: Secondary | ICD-10-CM | POA: Diagnosis present

## 2016-12-19 DIAGNOSIS — I6789 Other cerebrovascular disease: Secondary | ICD-10-CM | POA: Diagnosis not present

## 2016-12-19 DIAGNOSIS — D696 Thrombocytopenia, unspecified: Secondary | ICD-10-CM | POA: Diagnosis present

## 2016-12-19 DIAGNOSIS — R471 Dysarthria and anarthria: Secondary | ICD-10-CM | POA: Diagnosis present

## 2016-12-19 DIAGNOSIS — I739 Peripheral vascular disease, unspecified: Secondary | ICD-10-CM | POA: Diagnosis not present

## 2016-12-19 DIAGNOSIS — E785 Hyperlipidemia, unspecified: Secondary | ICD-10-CM | POA: Diagnosis present

## 2016-12-19 DIAGNOSIS — Z888 Allergy status to other drugs, medicaments and biological substances status: Secondary | ICD-10-CM

## 2016-12-19 DIAGNOSIS — R531 Weakness: Secondary | ICD-10-CM | POA: Diagnosis not present

## 2016-12-19 DIAGNOSIS — I082 Rheumatic disorders of both aortic and tricuspid valves: Secondary | ICD-10-CM | POA: Diagnosis not present

## 2016-12-19 DIAGNOSIS — Z880 Allergy status to penicillin: Secondary | ICD-10-CM

## 2016-12-19 DIAGNOSIS — Z8673 Personal history of transient ischemic attack (TIA), and cerebral infarction without residual deficits: Secondary | ICD-10-CM | POA: Diagnosis not present

## 2016-12-19 DIAGNOSIS — D649 Anemia, unspecified: Secondary | ICD-10-CM | POA: Diagnosis present

## 2016-12-19 DIAGNOSIS — G8194 Hemiplegia, unspecified affecting left nondominant side: Secondary | ICD-10-CM | POA: Diagnosis not present

## 2016-12-19 HISTORY — DX: Unspecified convulsions: R56.9

## 2016-12-19 HISTORY — DX: Other complications of anesthesia, initial encounter: T88.59XA

## 2016-12-19 HISTORY — DX: Other chronic pain: G89.29

## 2016-12-19 HISTORY — DX: Cerebral infarction, unspecified: I63.9

## 2016-12-19 HISTORY — DX: Unspecified osteoarthritis, unspecified site: M19.90

## 2016-12-19 HISTORY — DX: Low back pain, unspecified: M54.50

## 2016-12-19 HISTORY — DX: Low back pain: M54.5

## 2016-12-19 HISTORY — DX: Adverse effect of unspecified anesthetic, initial encounter: T41.45XA

## 2016-12-19 LAB — I-STAT CHEM 8, ED
BUN: 45 mg/dL — ABNORMAL HIGH (ref 6–20)
CHLORIDE: 110 mmol/L (ref 101–111)
CREATININE: 2.1 mg/dL — AB (ref 0.44–1.00)
Calcium, Ion: 1.13 mmol/L — ABNORMAL LOW (ref 1.15–1.40)
Glucose, Bld: 81 mg/dL (ref 65–99)
HEMATOCRIT: 40 % (ref 36.0–46.0)
HEMOGLOBIN: 13.6 g/dL (ref 12.0–15.0)
Potassium: 4.4 mmol/L (ref 3.5–5.1)
Sodium: 142 mmol/L (ref 135–145)
TCO2: 23 mmol/L (ref 0–100)

## 2016-12-19 LAB — URINALYSIS, ROUTINE W REFLEX MICROSCOPIC
BACTERIA UA: NONE SEEN
Bilirubin Urine: NEGATIVE
GLUCOSE, UA: NEGATIVE mg/dL
Ketones, ur: NEGATIVE mg/dL
LEUKOCYTES UA: NEGATIVE
NITRITE: NEGATIVE
PROTEIN: NEGATIVE mg/dL
SPECIFIC GRAVITY, URINE: 1.019 (ref 1.005–1.030)
pH: 5 (ref 5.0–8.0)

## 2016-12-19 LAB — I-STAT TROPONIN, ED: Troponin i, poc: 0.01 ng/mL (ref 0.00–0.08)

## 2016-12-19 LAB — PROTIME-INR
INR: 1.1
Prothrombin Time: 14.3 seconds (ref 11.4–15.2)

## 2016-12-19 LAB — COMPREHENSIVE METABOLIC PANEL
ALBUMIN: 2.7 g/dL — AB (ref 3.5–5.0)
ALK PHOS: 63 U/L (ref 38–126)
ALT: 13 U/L — ABNORMAL LOW (ref 14–54)
AST: 30 U/L (ref 15–41)
Anion gap: 8 (ref 5–15)
BILIRUBIN TOTAL: 0.9 mg/dL (ref 0.3–1.2)
BUN: 43 mg/dL — AB (ref 6–20)
CALCIUM: 8.4 mg/dL — AB (ref 8.9–10.3)
CO2: 24 mmol/L (ref 22–32)
Chloride: 109 mmol/L (ref 101–111)
Creatinine, Ser: 2.02 mg/dL — ABNORMAL HIGH (ref 0.44–1.00)
GFR calc Af Amer: 26 mL/min — ABNORMAL LOW (ref >60)
GFR calc non Af Amer: 23 mL/min — ABNORMAL LOW (ref >60)
GLUCOSE: 79 mg/dL (ref 65–99)
Potassium: 4.5 mmol/L (ref 3.5–5.1)
SODIUM: 141 mmol/L (ref 135–145)
TOTAL PROTEIN: 6.7 g/dL (ref 6.5–8.1)

## 2016-12-19 LAB — DIFFERENTIAL
Basophils Absolute: 0.1 10*3/uL (ref 0.0–0.1)
Basophils Relative: 1 %
EOS PCT: 9 %
Eosinophils Absolute: 0.6 10*3/uL (ref 0.0–0.7)
LYMPHS ABS: 2 10*3/uL (ref 0.7–4.0)
LYMPHS PCT: 29 %
Monocytes Absolute: 1.2 10*3/uL — ABNORMAL HIGH (ref 0.1–1.0)
Monocytes Relative: 18 %
NEUTROS ABS: 3 10*3/uL (ref 1.7–7.7)
NEUTROS PCT: 44 %

## 2016-12-19 LAB — CBC
HCT: 39.2 % (ref 36.0–46.0)
HEMOGLOBIN: 12.5 g/dL (ref 12.0–15.0)
MCH: 29.3 pg (ref 26.0–34.0)
MCHC: 31.9 g/dL (ref 30.0–36.0)
MCV: 92 fL (ref 78.0–100.0)
Platelets: 90 10*3/uL — ABNORMAL LOW (ref 150–400)
RBC: 4.26 MIL/uL (ref 3.87–5.11)
RDW: 13.7 % (ref 11.5–15.5)
WBC: 6.9 10*3/uL (ref 4.0–10.5)

## 2016-12-19 LAB — T4, FREE: FREE T4: 1.3 ng/dL — AB (ref 0.61–1.12)

## 2016-12-19 LAB — RAPID URINE DRUG SCREEN, HOSP PERFORMED
Amphetamines: POSITIVE — AB
BARBITURATES: NOT DETECTED
Benzodiazepines: NOT DETECTED
Cocaine: NOT DETECTED
OPIATES: POSITIVE — AB
TETRAHYDROCANNABINOL: NOT DETECTED

## 2016-12-19 LAB — ETHANOL: Alcohol, Ethyl (B): <5 mg/dL (ref <5)

## 2016-12-19 LAB — TSH: TSH: 1.245 u[IU]/mL (ref 0.350–4.500)

## 2016-12-19 LAB — APTT: APTT: 33 s (ref 24–36)

## 2016-12-19 MED ORDER — ONDANSETRON HCL 4 MG PO TABS: 4.0000 mg | ORAL_TABLET | Freq: Four times a day (QID) | ORAL | Status: DC | PRN

## 2016-12-19 MED ORDER — METHOCARBAMOL 500 MG PO TABS
500.0000 mg | ORAL_TABLET | Freq: Four times a day (QID) | ORAL | Status: DC | PRN
  Administered 2016-12-21: 500 mg via ORAL
  Filled 2016-12-19: qty 1

## 2016-12-19 MED ORDER — STROKE: EARLY STAGES OF RECOVERY BOOK
Freq: Once | Status: DC
  Filled 2016-12-19: qty 1

## 2016-12-19 MED ORDER — HEPARIN SODIUM (PORCINE) 5000 UNIT/ML IJ SOLN
5000.0000 [IU] | Freq: Three times a day (TID) | INTRAMUSCULAR | Status: DC
  Administered 2016-12-19 – 2016-12-21 (×6): 5000 [IU] via SUBCUTANEOUS
  Filled 2016-12-19 (×6): qty 1

## 2016-12-19 MED ORDER — LEVOTHYROXINE SODIUM 100 MCG PO TABS
100.0000 ug | ORAL_TABLET | Freq: Every day | ORAL | Status: DC
  Administered 2016-12-20 – 2016-12-21 (×2): 100 ug via ORAL
  Filled 2016-12-19 (×2): qty 1

## 2016-12-19 MED ORDER — DIVALPROEX SODIUM 500 MG PO DR TAB
500.0000 mg | DELAYED_RELEASE_TABLET | Freq: Two times a day (BID) | ORAL | Status: DC
  Administered 2016-12-19 – 2016-12-21 (×4): 500 mg via ORAL
  Filled 2016-12-19 (×4): qty 1

## 2016-12-19 MED ORDER — SODIUM CHLORIDE 0.9 % IV SOLN: INTRAVENOUS | Status: DC

## 2016-12-19 MED ORDER — HYDROCODONE-ACETAMINOPHEN 5-325 MG PO TABS
1.0000 | ORAL_TABLET | Freq: Two times a day (BID) | ORAL | Status: DC | PRN
  Administered 2016-12-19 – 2016-12-21 (×2): 1 via ORAL
  Filled 2016-12-19 (×2): qty 1

## 2016-12-19 MED ORDER — PRAVASTATIN SODIUM 40 MG PO TABS
40.0000 mg | ORAL_TABLET | Freq: Every day | ORAL | Status: DC
  Administered 2016-12-19 – 2016-12-21 (×3): 40 mg via ORAL
  Filled 2016-12-19 (×3): qty 1

## 2016-12-19 MED ORDER — HYDRALAZINE HCL 20 MG/ML IJ SOLN: 5.0000 mg | INTRAMUSCULAR | Status: DC | PRN

## 2016-12-19 MED ORDER — SUCRALFATE 1 G PO TABS
1.0000 g | ORAL_TABLET | Freq: Two times a day (BID) | ORAL | Status: DC
  Administered 2016-12-19 – 2016-12-21 (×4): 1 g via ORAL
  Filled 2016-12-19 (×4): qty 1

## 2016-12-19 MED ORDER — ZONISAMIDE 100 MG PO CAPS
100.0000 mg | ORAL_CAPSULE | Freq: Two times a day (BID) | ORAL | Status: DC
  Administered 2016-12-20 – 2016-12-21 (×3): 100 mg via ORAL
  Filled 2016-12-19 (×5): qty 1

## 2016-12-19 MED ORDER — ASPIRIN 300 MG RE SUPP: 300.0000 mg | Freq: Every day | RECTAL | Status: DC

## 2016-12-19 MED ORDER — IOPAMIDOL (ISOVUE-370) INJECTION 76%
INTRAVENOUS | Status: AC
  Filled 2016-12-19: qty 50

## 2016-12-19 MED ORDER — ASPIRIN 325 MG PO TABS
325.0000 mg | ORAL_TABLET | Freq: Every day | ORAL | Status: DC
  Administered 2016-12-20 – 2016-12-21 (×2): 325 mg via ORAL
  Filled 2016-12-19 (×2): qty 1

## 2016-12-19 MED ORDER — PANTOPRAZOLE SODIUM 40 MG PO TBEC
80.0000 mg | DELAYED_RELEASE_TABLET | Freq: Every day | ORAL | Status: DC
  Administered 2016-12-20 – 2016-12-21 (×2): 80 mg via ORAL
  Filled 2016-12-19 (×2): qty 2

## 2016-12-19 MED ORDER — ONDANSETRON HCL 4 MG/2ML IJ SOLN: 4.0000 mg | Freq: Four times a day (QID) | INTRAMUSCULAR | Status: DC | PRN

## 2016-12-19 MED ORDER — FEBUXOSTAT 40 MG PO TABS
40.0000 mg | ORAL_TABLET | Freq: Every day | ORAL | Status: DC
  Administered 2016-12-20 – 2016-12-21 (×2): 40 mg via ORAL
  Filled 2016-12-19 (×3): qty 1

## 2016-12-19 NOTE — Evaluation (Signed)
Clinical/Bedside Swallow Evaluation Patient Details  Name: Dana Harrington MRN: [id number] Date of Birth: [birth year]  Today's Date: 12/19/2016 Time: SLP Start Time (ACUTE ONLY): 40 SLP Stop Time (ACUTE ONLY): 1629 SLP Time Calculation (min) (ACUTE ONLY): 17 min  Past Medical History:  Past Medical History:  Diagnosis Date  . Aortic stenosis   . GERD (gastroesophageal reflux disease)   . Gout   . Hypertension   . Hypothyroidism   . LFT elevation   . Paronychia of fourth toe of right foot   . Seizure disorder ([place])    since age 35  . Vitamin D deficiency   . Zoster 02/2009   Past Surgical History:  Past Surgical History:  Procedure Laterality Date  . LAPAROSCOPIC CHOLECYSTECTOMY  11/2003  . TUBAL LIGATION  1970   HPI:  77 y.o.femalewith medical history significant of gout, hypertension, hypothyroidism, seizures presenting with left-sided weakness and facial droop. MRI pending.   Assessment / Plan / Recommendation Clinical Impression  Pt has mild left-sided facial weakness and tongue deviation with mild left-sided anterior loss noted with pureed solids without pt awareness. Mastication is prolonged which is likely in part due to baseline lack of dentition, although suspect also impacted by acute onset of weakness. She had an immediate cough x1 following large, sequential boluses of thin liquids by straw, but this could not be replicated with additional large boluses by cup. Family does report intermittent coughing throughout the day. Recommend Dys 2 diet and thin liquids with additional precautions including no straw and meds whole in puree. Will f/u for tolerance and completion of speech/language evaluation. SLP Visit Diagnosis: Dysphagia, unspecified (R13.10)    Aspiration Risk  Mild aspiration risk    Diet Recommendation Dysphagia 2 (Fine chop);Thin liquid   Liquid Administration via: Cup;No straw Medication Administration: Whole meds with puree Supervision: Patient  able to self feed;Full supervision/cueing for compensatory strategies Compensations: Slow rate;Small sips/bites;Monitor for anterior loss Postural Changes: Seated upright at 90 degrees    Other  Recommendations Oral Care Recommendations: Oral care BID   Follow up Recommendations  (tba)      Frequency and Duration min 2x/week  2 weeks       Prognosis Prognosis for Safe Diet Advancement: Good      Swallow Study   General HPI: 76 y.o.femalewith medical history significant of gout, hypertension, hypothyroidism, seizures presenting with left-sided weakness and facial droop. MRI pending. Type of Study: Bedside Swallow Evaluation Previous Swallow Assessment: none in chart Diet Prior to this Study: NPO Temperature Spikes Noted: No Respiratory Status: Room air History of Recent Intubation: No Behavior/Cognition: Alert;Cooperative;Pleasant mood;Requires cueing Oral Cavity Assessment: Within Functional Limits Oral Care Completed by SLP: No Oral Cavity - Dentition: Missing dentition Vision: Functional for self-feeding Self-Feeding Abilities: Able to feed self Patient Positioning: Upright in bed Baseline Vocal Quality: Normal Volitional Swallow: Unable to elicit    Oral/Motor/Sensory Function Overall Oral Motor/Sensory Function: Mild impairment (Mild-Mod) Facial ROM: Reduced left;Suspected CN VII (facial) dysfunction Facial Symmetry: Abnormal symmetry left;Suspected CN VII (facial) dysfunction Facial Strength: Reduced left;Suspected CN VII (facial) dysfunction Facial Sensation: Reduced left;Suspected CN V (Trigeminal) dysfunction Lingual ROM: Reduced right;Suspected CN XII (hypoglossal) dysfunction Lingual Symmetry: Abnormal symmetry left;Suspected CN XII (hypoglossal) dysfunction   Ice Chips Ice chips: Within functional limits Presentation: Spoon   Thin Liquid Thin Liquid: Impaired Presentation: Cup;Self Fed;Straw Pharyngeal  Phase Impairments: Cough - Immediate    Nectar Thick  Nectar Thick Liquid: Not tested   Honey Thick Honey Thick Liquid: Not  tested   Puree Puree: Impaired Presentation: Self Fed;Spoon Oral Phase Functional Implications: Left anterior spillage   Solid   GO   Solid: Impaired Presentation: Self Fed Oral Phase Impairments: Impaired mastication        Germain Osgood 12/19/2016,5:47 PM  Germain Osgood, M.A. CCC-SLP [phone]

## 2016-12-19 NOTE — ED Provider Notes (Signed)
[facility] DEPT Provider Note   CSN: [id number] Arrival date & time: 12/19/16  1059     History   Chief Complaint Chief Complaint  Patient presents with  . Cerebrovascular Accident    HPI EMIE SOMMERFELD is a 76 y.o. female.  HPI    76 year old female with a history of hypertension, hyperlipidemia, epilepsy, aortic stenosis who presents with concern for left arm and leg weakness, facial droop, slurred speech noted upon waking this morning.  Last low-normal was midnight.  Daughter says that she saw patient midnight, and she was at her baseline. Reports grandaughter went to go check on her this morning, however she did not come to the door-Then daughter came, and brought keys, and was able to get into the house, and found patient laying in her bed. Patient reports she was worried she had a stroke. It felt that she could not get out of bed.  Reports she had weakness. Family reports slurred speech and drooping of the face.  Report she has also had a cough/congestion for a week.   Past Medical History:  Diagnosis Date  . Aortic stenosis   . GERD (gastroesophageal reflux disease)   . Gout   . Hypertension   . Hypothyroidism   . LFT elevation   . Paronychia of fourth toe of right foot   . Seizure disorder ([place])    since age 34  . Vitamin D deficiency   . Zoster 02/2009    Patient Active Problem List   Diagnosis Date Noted  . 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 Ischemic stroke ([place]) 12/19/2016  . Essential hypertension 12/19/2016  . Hypothyroidism 12/19/2016  . Musculoskeletal pain 12/19/2016  . GERD (gastroesophageal reflux disease) 12/19/2016  . Seizure ([place]) 12/19/2016  . Gout 12/19/2016    Past Surgical History:  Procedure Laterality Date  . LAPAROSCOPIC CHOLECYSTECTOMY  11/2003  . TUBAL LIGATION  1970    OB History    No data available       Home Medications    Prior to Admission medications   Medication Sig Start Date End Date Taking? Authorizing  Provider  acetaminophen (TYLENOL) 325 MG tablet Take 325 mg by mouth every 6 (six) hours as needed for pain.   Yes Historical Provider, MD  carvedilol (COREG) 12.5 MG tablet Take 12.5 mg by mouth 2 (two) times daily with a meal.   Yes Historical Provider, MD  divalproex (DEPAKOTE) 500 MG DR tablet Take 500 mg by mouth 2 (two) times daily.   Yes Historical Provider, MD  febuxostat (ULORIC) 40 MG tablet Take 40 mg by mouth daily.   Yes Historical Provider, MD  HYDROcodone-acetaminophen (NORCO/VICODIN) 5-325 MG per tablet Take 1 tablet by mouth 2 (two) times daily as needed for pain.   Yes Historical Provider, MD  ibuprofen (ADVIL,MOTRIN) 200 MG tablet Take 200 mg by mouth every 6 (six) hours as needed for moderate pain.    Yes Historical Provider, MD  levothyroxine (SYNTHROID, LEVOTHROID) 100 MCG tablet Take 100 mcg by mouth daily before breakfast.   Yes Historical Provider, MD  loratadine-pseudoephedrine (CLARITIN-D 24-HOUR) 10-240 MG 24 hr tablet Take 1 tablet by mouth daily.   Yes Historical Provider, MD  methocarbamol (ROBAXIN) 500 MG tablet Take 500 mg by mouth every 6 (six) hours as needed for muscle spasms. 12/02/16  Yes Historical Provider, MD  omeprazole (PRILOSEC) 40 MG capsule Take 1 capsule by mouth 2 (two) times daily.  07/11/15  Yes Historical Provider, MD  pravastatin (PRAVACHOL) 40  MG tablet Take 40 mg by mouth daily.   Yes Historical Provider, MD  sucralfate (CARAFATE) 1 G tablet Take 1 tablet by mouth 2 (two) times daily. 07/11/15  Yes Historical Provider, MD  Vitamin D, Ergocalciferol, (DRISDOL) 50000 units CAPS capsule Take 50,000 Units by mouth once a week. 12/13/16  Yes Historical Provider, MD  zonisamide (ZONEGRAN) 100 MG capsule Take 100 mg by mouth 2 (two) times daily.    Yes Historical Provider, MD    Family History Family History  Problem Relation Age of Onset  . Heart disease Father   . Seizures Mother   . Dementia Brother   . Sleep disorder Sister     Social  History Social History  Substance Use Topics  . Smoking status: Never Smoker  . Smokeless tobacco: Not on file  . Alcohol use No     Allergies   Gabapentin and Penicillins   Review of Systems Review of Systems  Constitutional: Negative for fever.  HENT: Positive for congestion. Negative for sore throat.   Eyes: Negative for visual disturbance.  Respiratory: Positive for cough. Negative for shortness of breath.   Cardiovascular: Negative for chest pain.  Gastrointestinal: Negative for abdominal pain.  Genitourinary: Negative for difficulty urinating.  Musculoskeletal: Positive for gait problem. Negative for back pain.  Skin: Negative for rash.  Neurological: Positive for facial asymmetry, speech difficulty, weakness and numbness. Negative for syncope and headaches.     Physical Exam Updated Vital Signs BP (!) 166/60   Pulse 66   Temp 97.3 F (36.3 C) (Oral)   Resp 16   SpO2 96%   Physical Exam  Constitutional: She is oriented to person, place, and time. She appears well-developed and well-nourished. No distress.  HENT:  Head: Normocephalic and atraumatic.  Eyes: Conjunctivae and EOM are normal.  Neck: Normal range of motion.  Cardiovascular: Normal rate, regular rhythm, normal heart sounds and intact distal pulses.  Exam reveals no gallop and no friction rub.   No murmur heard. Pulmonary/Chest: Effort normal and breath sounds normal. No respiratory distress. She has no wheezes. She has no rales.  Abdominal: Soft. She exhibits no distension. There is no tenderness. There is no guarding.  Musculoskeletal: She exhibits no edema or tenderness.  Neurological: She is alert and oriented to person, place, and time. A cranial nerve deficit (slight left sided facial droop) and sensory deficit (left arm and leg) is present. GCS eye subscore is 4. GCS verbal subscore is 5. GCS motor subscore is 6.  Left sided pronator drift Left sided lower extremity slight drift  (bouncing) Cannot perform finger to nose on left due to weakness Normal EOM Slurred speech, mild Speech otherwise appears fluent on my exam  Skin: Skin is warm and dry. No rash noted. She is not diaphoretic. No erythema.  Nursing note and vitals reviewed.    ED Treatments / Results  Labs (all labs ordered are listed, but only abnormal results are displayed) Labs Reviewed  CBC - Abnormal; Notable for the following:       Result Value   Platelets 90 (*)    All other components within normal limits  DIFFERENTIAL - Abnormal; Notable for the following:    Monocytes Absolute 1.2 (*)    All other components within normal limits  COMPREHENSIVE METABOLIC PANEL - Abnormal; Notable for the following:    BUN 43 (*)    Creatinine, Ser 2.02 (*)    Calcium 8.4 (*)    Albumin 2.7 (*)  ALT 13 (*)    GFR calc non Af Amer 23 (*)    GFR calc Af Amer 26 (*)    All other components within normal limits  RAPID URINE DRUG SCREEN, HOSP PERFORMED - Abnormal; Notable for the following:    Opiates POSITIVE (*)    Amphetamines POSITIVE (*)    All other components within normal limits  URINALYSIS, ROUTINE W REFLEX MICROSCOPIC - Abnormal; Notable for the following:    Hgb urine dipstick SMALL (*)    Squamous Epithelial / LPF 0-5 (*)    All other components within normal limits  I-STAT CHEM 8, ED - Abnormal; Notable for the following:    BUN 45 (*)    Creatinine, Ser 2.10 (*)    Calcium, Ion 1.13 (*)    All other components within normal limits  ETHANOL  PROTIME-INR  APTT  TSH  T4, FREE  T3, FREE  I-STAT TROPOININ, ED    EKG  EKG Interpretation None       Radiology Dg Chest 2 View  Result Date: 12/19/2016 CLINICAL DATA:  Cough.  Stroke symptoms with left-sided weakness EXAM: CHEST  2 VIEW COMPARISON:  07/18/2015 FINDINGS: Cardiac enlargement without heart failure. Negative for pneumonia or effusion. No mass lesion. Improved aeration compared with the prior study. Atherosclerotic  aorta. IMPRESSION: Cardiac enlargement.  No acute abnormality. Electronically Signed   By: Franchot Gallo M.D.   On: 12/19/2016 13:13   Ct Head Wo Contrast  Result Date: 12/19/2016 CLINICAL DATA:  Left-sided weakness EXAM: CT HEAD WITHOUT CONTRAST TECHNIQUE: Contiguous axial images were obtained from the base of the skull through the vertex without intravenous contrast. COMPARISON:  07/05/2004 FINDINGS: Brain: No evidence of acute infarction, hemorrhage, hydrocephalus, extra-axial collection or mass lesion/mass effect. Vascular: No hyperdense vessel or unexpected calcification. Skull: Normal. Negative for fracture or focal lesion. Sinuses/Orbits: No acute finding. Other: None. IMPRESSION: No acute intracranial abnormality noted. Electronically Signed   By: Inez Catalina M.D.   On: 12/19/2016 11:40    Procedures Procedures (including critical care time)  Medications Ordered in ED Medications  pantoprazole (PROTONIX) EC tablet 80 mg (not administered)  methocarbamol (ROBAXIN) tablet 500 mg (not administered)  levothyroxine (SYNTHROID, LEVOTHROID) tablet 100 mcg (not administered)  sucralfate (CARAFATE) tablet 1 g (not administered)  febuxostat (ULORIC) tablet 40 mg (not administered)  divalproex (DEPAKOTE) DR tablet 500 mg (not administered)  pravastatin (PRAVACHOL) tablet 40 mg (not administered)  zonisamide (ZONEGRAN) capsule 100 mg (not administered)  HYDROcodone-acetaminophen (NORCO/VICODIN) 5-325 MG per tablet 1 tablet (not administered)   stroke: mapping our early stages of recovery book (not administered)  aspirin suppository 300 mg (not administered)    Or  aspirin tablet 325 mg (not administered)  heparin injection 5,000 Units (not administered)  0.9 %  sodium chloride infusion (not administered)  ondansetron (ZOFRAN) tablet 4 mg (not administered)    Or  ondansetron (ZOFRAN) injection 4 mg (not administered)  hydrALAZINE (APRESOLINE) injection 5-10 mg (not administered)     CRITICAL CARE: CVA, initial consideration of intervention/emergent neuro consultation Performed by: Alvino Chapel   Total critical care time: 30 minutes  Critical care time was exclusive of separately billable procedures and treating other patients.  Critical care was necessary to treat or prevent imminent or life-threatening deterioration.  Critical care was time spent personally by me on the following activities: development of treatment plan with patient and/or surrogate as well as nursing, discussions with consultants, evaluation of patient's response to treatment, examination of  patient, obtaining history from patient or surrogate, ordering and performing treatments and interventions, ordering and review of laboratory studies, ordering and review of radiographic studies, pulse oximetry and re-evaluation of patient's condition.   Initial Impression / Assessment and Plan / ED Course  I have reviewed the triage vital signs and the nursing notes.  Pertinent labs & imaging results that were available during my care of the patient were reviewed by me and considered in my medical decision making (see chart for details).     76 year old female with a history of hypertension, hyperlipidemia, epilepsy, aortic stenosis who presents with concern for left arm and leg weakness, facial droop, slurred speech noted upon waking this morning.  Last low-normal was midnight. Given patient woke up with strokelike symptoms, possible intervention candidate, emergently called neurology regarding evaluation and treatment. Spoke with Dr. Shon Hale, and initially ordered CT angio head and neck, however patient's creatinine is elevated. Discussed risks and benefits with family, and opted to do only noncontrast scan.  Dr. Shon Hale came to the bedside, and evaluated patient and recommend admission for stroke evaluation.  Patient initially with hypothermia, repeat temp 97 after warm blankets. No signs or  symptoms of sepsis. XR negative.  Will admit for stroke evaluation.   Final Clinical Impressions(s) / ED Diagnoses   Final diagnoses:  Left-sided weakness  Stroke (cerebrum) ([place])  Cerebrovascular accident (CVA), unspecified mechanism ([place])    New Prescriptions New Prescriptions   No medications on file     Gareth Morgan, MD 12/19/16 1620

## 2016-12-19 NOTE — ED Notes (Signed)
ED Provider at bedside. 

## 2016-12-19 NOTE — ED Triage Notes (Signed)
Pt arrives from home via [facility] c/o new onset L sided weakness, L facial droop with expressive aphasia and slurred speech.  EMS reports pt LSW midnight,  Was found at 0930 in bed with s/s.  Pt AOx4, resp e/u.

## 2016-12-19 NOTE — Consult Note (Signed)
Requesting Physician: ED MD    Chief Complaint: stroke  History obtained from:  Patient     HPI:                                                                                                                                         Dana Harrington is an 76 y.o. female was last seen normal at 12 PM last night. Patient woke this morning and noted that she had significant weakness in her left arm and weakness in her left leg. She was unable to get out of bed. Patient was brought to the emergency department. While in the emergency department it was noted that she had a left facial droop, a left arm significant weakness, left leg weakness. CT scan was obtained of the brain which did not show any acute stroke. No code stroke was called secondary to the fact that she was out of the window. Currently she is awake alert and able to answer questions. She had a headache on arrival but at this point does not. Patient apparently does have a seizure history but has not had a seizure and a significant period of time. She follows Dr. Brett Fairy as an outpatient with neurology.  Date last known well: Date: 12/19/2016 Time last known well: Time: 00:00 tPA Given: No: out of window   Past Medical History:  Diagnosis Date  . Aortic stenosis   . GERD (gastroesophageal reflux disease)   . Gout   . Hypertension   . Hypothyroidism   . LFT elevation   . Paronychia of fourth toe of right foot   . Seizure disorder ([place])    since age 69  . Vitamin D deficiency   . Zoster 02/2009    Past Surgical History:  Procedure Laterality Date  . LAPAROSCOPIC CHOLECYSTECTOMY  11/2003  . TUBAL LIGATION  1970    Family History  Problem Relation Age of Onset  . Heart disease Father   . Seizures Mother   . Dementia Brother   . Sleep disorder Sister    Social History:  reports that she has never smoked. She does not have any smokeless tobacco history on file. She reports that she does not drink alcohol or use  drugs.  Allergies:  Allergies  Allergen Reactions  . Gabapentin Other (See Comments)    Bouncing   . Penicillins Other (See Comments)    n    Medications:  No current facility-administered medications for this encounter.    Current Outpatient Prescriptions  Medication Sig Dispense Refill  . acetaminophen (TYLENOL) 325 MG tablet Take 325 mg by mouth every 6 (six) hours as needed for pain.    . carvedilol (COREG) 12.5 MG tablet Take 12.5 mg by mouth 2 (two) times daily with a meal.    . divalproex (DEPAKOTE) 500 MG DR tablet Take 500 mg by mouth 2 (two) times daily.    . febuxostat (ULORIC) 40 MG tablet Take 40 mg by mouth daily.    . furosemide (LASIX) 20 MG tablet Take 20 mg by mouth as needed for fluid.     Marland Kitchen HYDROcodone-acetaminophen (NORCO/VICODIN) 5-325 MG per tablet Take 1 tablet by mouth 2 (two) times daily as needed for pain.    Marland Kitchen ibuprofen (ADVIL,MOTRIN) 200 MG tablet Take 200 mg by mouth as needed for pain.    Marland Kitchen levothyroxine (SYNTHROID, LEVOTHROID) 75 MCG tablet Take 75 mcg by mouth daily before breakfast.    . omeprazole (PRILOSEC) 40 MG capsule Take 1 capsule by mouth 2 (two) times daily.   3  . Phenylephrine-Pheniramine-DM (THERAFLU COLD & COUGH) 07-20-19 MG PACK Take 1 tablet by mouth every 4 (four) hours.    . pravastatin (PRAVACHOL) 40 MG tablet Take 40 mg by mouth daily.    . sucralfate (CARAFATE) 1 G tablet Take 1 tablet by mouth 2 (two) times daily.  3  . zonisamide (ZONEGRAN) 100 MG capsule Take 100 mg by mouth 2 (two) times daily.        ROS:                                                                                                                                       History obtained from the patient  General ROS: negative for - chills, fatigue, fever, night sweats, weight gain or weight loss Psychological ROS: negative for -  behavioral disorder, hallucinations, memory difficulties, mood swings or suicidal ideation Ophthalmic ROS: negative for - blurry vision, double vision, eye pain or loss of vision ENT ROS: negative for - epistaxis, nasal discharge, oral lesions, sore throat, tinnitus or vertigo Allergy and Immunology ROS: negative for - hives or itchy/watery eyes Hematological and Lymphatic ROS: negative for - bleeding problems, bruising or swollen lymph nodes Endocrine ROS: negative for - galactorrhea, hair pattern changes, polydipsia/polyuria or temperature intolerance Respiratory ROS: negative for - cough, hemoptysis, shortness of breath or wheezing Cardiovascular ROS: negative for - chest pain, dyspnea on exertion, edema or irregular heartbeat Gastrointestinal ROS: negative for - abdominal pain, diarrhea, hematemesis, nausea/vomiting or stool incontinence Genito-Urinary ROS: negative for - dysuria, hematuria, incontinence or urinary frequency/urgency Musculoskeletal ROS: Positive for -  muscular weakness Neurological ROS: as noted in HPI Dermatological ROS: negative for rash and skin lesion changes  Neurologic Examination:  Blood pressure 122/84, pulse 65, temperature (!) 95.1 F (35.1 C), temperature source Rectal, resp. rate 16, SpO2 99 %.  HEENT-  Normocephalic, no lesions, without obvious abnormality.  Normal external eye and conjunctiva.  Normal TM's bilaterally.  Normal auditory canals and external ears. Normal external nose, mucus membranes and septum.  Normal pharynx. Cardiovascular- S1, S2 normal, pulses palpable throughout   Lungs- chest clear, no wheezing, rales, normal symmetric air entry Abdomen- normal findings: bowel sounds normal Extremities- no edema Lymph-no adenopathy palpable Musculoskeletal-no joint tenderness, deformity or swelling Skin-warm and dry, no hyperpigmentation, vitiligo, or  suspicious lesions  Neurological Examination Mental Status: Alert, oriented, thought content appropriate.  Speech dysarthric without evidence of aphasia.  Able to follow 3 step commands without difficulty. Cranial Nerves: II:  Visual fields grossly normal,  III,IV, VI: ptosis not present, extra-ocular motions intact bilaterally, pupils equal, round, reactive to light and accommodation V,VII: smile asymmetric on the left, facial light touch sensation normal bilaterally VIII: hearing normal bilaterally IX,X: uvula rises symmetrically XI: bilateral shoulder shrug XII: Slight deviation of the tongue to the left Motor: Right : Upper extremity   5/5    Left:     Upper extremity   see note  Lower extremity   5/5     Lower extremity   3/5 --Patient has 0/5 strength in her hand, 1/5 with biceps flexion and triceps extension. She has 3/5 strength with shoulder abduction Tone and bulk:normal tone throughout; no atrophy noted Sensory: Decreased in her left arm and left leg Deep Tendon Reflexes: Patient has a no deep tendon reflexes at the ankles or knees. She has 2+ biceps and triceps on the right with 1+ bicep tricep on the left Plantars: Right: downgoing   Left: downgoing Cerebellar: normal finger-to-nose on the right,  Gait: Not tested for safety       Lab Results: Basic Metabolic Panel:  Recent Labs Lab 12/19/16 1105 12/19/16 1125  NA 141 142  K 4.5 4.4  CL 109 110  CO2 24  --   GLUCOSE 79 81  BUN 43* 45*  CREATININE 2.02* 2.10*  CALCIUM 8.4*  --     Liver Function Tests:  Recent Labs Lab 12/19/16 1105  AST 30  ALT 13*  ALKPHOS 63  BILITOT 0.9  PROT 6.7  ALBUMIN 2.7*   No results for input(s): LIPASE, AMYLASE in the last 168 hours. No results for input(s): AMMONIA in the last 168 hours.  CBC:  Recent Labs Lab 12/19/16 1105 12/19/16 1125  WBC 6.9  --   NEUTROABS 3.0  --   HGB 12.5 13.6  HCT 39.2 40.0  MCV 92.0  --   PLT 90*  --     Cardiac  Enzymes: No results for input(s): CKTOTAL, CKMB, CKMBINDEX, TROPONINI in the last 168 hours.  Lipid Panel: No results for input(s): CHOL, TRIG, HDL, CHOLHDL, VLDL, LDLCALC in the last 168 hours.  CBG: No results for input(s): GLUCAP in the last 168 hours.  Microbiology: Results for orders placed or performed during the hospital encounter of 07/17/15  Urine culture     Status: None   Collection Time: 07/18/15  1:08 AM  Result Value Ref Range Status   Specimen Description URINE, CLEAN CATCH  Final   Special Requests NONE  Final   Culture   Final    MULTIPLE SPECIES PRESENT, SUGGEST RECOLLECTION Performed at [facility]    Report Status 07/19/2015 FINAL  Final    Coagulation Studies:  Recent  Labs  12/19/16 1105  LABPROT 14.3  INR 1.10    Imaging: Dg Chest 2 View  Result Date: 12/19/2016 CLINICAL DATA:  Cough.  Stroke symptoms with left-sided weakness EXAM: CHEST  2 VIEW COMPARISON:  07/18/2015 FINDINGS: Cardiac enlargement without heart failure. Negative for pneumonia or effusion. No mass lesion. Improved aeration compared with the prior study. Atherosclerotic aorta. IMPRESSION: Cardiac enlargement.  No acute abnormality. Electronically Signed   By: Franchot Gallo M.D.   On: 12/19/2016 13:13   Ct Head Wo Contrast  Result Date: 12/19/2016 CLINICAL DATA:  Left-sided weakness EXAM: CT HEAD WITHOUT CONTRAST TECHNIQUE: Contiguous axial images were obtained from the base of the skull through the vertex without intravenous contrast. COMPARISON:  07/05/2004 FINDINGS: Brain: No evidence of acute infarction, hemorrhage, hydrocephalus, extra-axial collection or mass lesion/mass effect. Vascular: No hyperdense vessel or unexpected calcification. Skull: Normal. Negative for fracture or focal lesion. Sinuses/Orbits: No acute finding. Other: None. IMPRESSION: No acute intracranial abnormality noted. Electronically Signed   By: Inez Catalina M.D.   On: 12/19/2016 11:40        Assessment and plan discussed with with attending physician and they are in agreement.    Etta Quill PA-C [facility] [phone]  12/19/2016, 1:21 PM   Assessment: 76 y.o. female presenting to the hospital with a left-sided weakness and decreased sensation. Patient likely suffered from a stroke that occurred at some point last night. Patient is not a TPA candidate as she is out of the window. Patient will benefit from a stroke workup.  Stroke Risk Factors - hypertension  Recommend: 1. HgbA1c, fasting lipid panel 2. MRI, MRA  of the brain without contrast 3. PT consult, OT consult, Speech consult 4. Echocardiogram 5. Carotid dopplers 6. Prophylactic therapy-Antiplatelet med: Aspirin - dose 325 mg daily after passing swallow screen 7. Risk factor modification 8. Telemetry monitoring 9. Frequent neuro checks 10 NPO until passes stroke swallow screen 11 please page stroke NP  Or  PA  Or MD from 8am -4 pm  as this patient from this time will be  followed by the stroke.   You can look them up on [email or web address]  Password [email or web address]

## 2016-12-19 NOTE — ED Notes (Signed)
Patient transported to CT 

## 2016-12-19 NOTE — ED Notes (Signed)
Patient transported to MRI 

## 2016-12-19 NOTE — H&P (Signed)
History and Physical    Dana Harrington:[id number] DOB: 10-03-1940 DOA: 12/19/2016  PCP: Pcp Not In System Patient coming from: Home  Chief Complaint: L sided weakness and facial droop  HPI: Dana Harrington is a 76 y.o. female with medical history significant of gout, hypertension, hypothyroidism, seizures presenting with left-sided weakness and facial droop. Patient's last known normal was at midnight when she went to bed the night prior to admission. Patient's symptoms are present when she awoke. Patient was unable to get out of bed due to weakness. She was found in bed around 09:30 and EMS was called to bring patient emergently to the ED.   ED Course: Code stroke initiated. Neuro felt the patient was outside the window for emergent TPA and have opted for general admission and stroke workup.  Review of Systems: As per HPI otherwise 10 point review of systems negative.   Ambulatory Status: prior to stroke no restrictions  Past Medical History:  Diagnosis Date  . Aortic stenosis   . GERD (gastroesophageal reflux disease)   . Gout   . Hypertension   . Hypothyroidism   . LFT elevation   . Paronychia of fourth toe of right foot   . Seizure disorder ([place])    since age 38  . Vitamin D deficiency   . Zoster 02/2009    Past Surgical History:  Procedure Laterality Date  . LAPAROSCOPIC CHOLECYSTECTOMY  11/2003  . TUBAL LIGATION  1970    Social History   Social History  . Marital status: Widowed    Spouse name: N/A  . Number of children: N/A  . Years of education: N/A   Occupational History  . Not on file.   Social History Main Topics  . Smoking status: Never Smoker  . Smokeless tobacco: Not on file  . Alcohol use No  . Drug use: No  . Sexual activity: Not on file   Other Topics Concern  . Not on file   Social History Narrative  . No narrative on file    Allergies  Allergen Reactions  . Gabapentin Other (See Comments)    "Bouncing"   . Penicillins Rash      Family History  Problem Relation Age of Onset  . Heart disease Father   . Seizures Mother   . Dementia Brother   . Sleep disorder Sister     Prior to Admission medications   Medication Sig Start Date End Date Taking? Authorizing Provider  acetaminophen (TYLENOL) 325 MG tablet Take 325 mg by mouth every 6 (six) hours as needed for pain.    Historical Provider, MD  carvedilol (COREG) 12.5 MG tablet Take 12.5 mg by mouth 2 (two) times daily with a meal.    Historical Provider, MD  divalproex (DEPAKOTE) 500 MG DR tablet Take 500 mg by mouth 2 (two) times daily.    Historical Provider, MD  febuxostat (ULORIC) 40 MG tablet Take 40 mg by mouth daily.    Historical Provider, MD  furosemide (LASIX) 20 MG tablet Take 20 mg by mouth as needed for fluid.     Historical Provider, MD  HYDROcodone-acetaminophen (NORCO/VICODIN) 5-325 MG per tablet Take 1 tablet by mouth 2 (two) times daily as needed for pain.    Historical Provider, MD  ibuprofen (ADVIL,MOTRIN) 200 MG tablet Take 200 mg by mouth as needed for pain.    Historical Provider, MD  levothyroxine (SYNTHROID, LEVOTHROID) 75 MCG tablet Take 75 mcg by mouth daily before breakfast.  Historical Provider, MD  omeprazole (PRILOSEC) 40 MG capsule Take 1 capsule by mouth 2 (two) times daily.  07/11/15   Historical Provider, MD  Phenylephrine-Pheniramine-DM [facility] COLD & COUGH) 07-20-19 MG PACK Take 1 tablet by mouth every 4 (four) hours.    Historical Provider, MD  pravastatin (PRAVACHOL) 40 MG tablet Take 40 mg by mouth daily.    Historical Provider, MD  sucralfate (CARAFATE) 1 G tablet Take 1 tablet by mouth 2 (two) times daily. 07/11/15   Historical Provider, MD  zonisamide (ZONEGRAN) 100 MG capsule Take 100 mg by mouth 2 (two) times daily.     Historical Provider, MD    Physical Exam: Vitals:   12/19/16 1430 12/19/16 1445 12/19/16 1500 12/19/16 1515  BP: (!) 167/56 (!) 171/56 (!) 163/50 (!) 166/60  Pulse: 63 (!) 59 62 66  Resp: 17 13 19  16   Temp:      TempSrc:      SpO2: 100% 98% 97% 96%     General:  Appears calm and comfortable Eyes:  PERRL, EOMI, normal lids, iris ENT:  grossly normal hearing, lips & tongue, mmm Neck:  no LAD, masses or thyromegaly Cardiovascular:  RRR, no m/r/g. No LE edema.  Respiratory:  CTA bilaterally, no w/r/r. Normal respiratory effort. Abdomen:  soft, ntnd, NABS Skin:  no rash or induration seen on limited exam Musculoskeletal:  Left extremities as noted below, no bony upper mild before meals, normal tone in right upper extremity and right lower extremity. Psychiatric:  grossly normal mood and affect, speech fluent and appropriate, AOx3 Neurologic:  Left facial droop, left grip strength 2/5, left foot dorsiflexion 2/5, right grip strength 5/5, right foot dorsiflexion 4/5     Labs on Admission: I have personally reviewed following labs and imaging studies  CBC:  Recent Labs Lab 12/19/16 1105 12/19/16 1125  WBC 6.9  --   NEUTROABS 3.0  --   HGB 12.5 13.6  HCT 39.2 40.0  MCV 92.0  --   PLT 90*  --    Basic Metabolic Panel:  Recent Labs Lab 12/19/16 1105 12/19/16 1125  NA 141 142  K 4.5 4.4  CL 109 110  CO2 24  --   GLUCOSE 79 81  BUN 43* 45*  CREATININE 2.02* 2.10*  CALCIUM 8.4*  --    GFR: CrCl cannot be calculated (Unknown ideal weight.). Liver Function Tests:  Recent Labs Lab 12/19/16 1105  AST 30  ALT 13*  ALKPHOS 63  BILITOT 0.9  PROT 6.7  ALBUMIN 2.7*   No results for input(s): LIPASE, AMYLASE in the last 168 hours. No results for input(s): AMMONIA in the last 168 hours. Coagulation Profile:  Recent Labs Lab 12/19/16 1105  INR 1.10   Cardiac Enzymes: No results for input(s): CKTOTAL, CKMB, CKMBINDEX, TROPONINI in the last 168 hours. BNP (last 3 results) No results for input(s): PROBNP in the last 8760 hours. HbA1C: No results for input(s): HGBA1C in the last 72 hours. CBG: No results for input(s): GLUCAP in the last 168 hours. Lipid  Profile: No results for input(s): CHOL, HDL, LDLCALC, TRIG, CHOLHDL, LDLDIRECT in the last 72 hours. Thyroid Function Tests: No results for input(s): TSH, T4TOTAL, FREET4, T3FREE, THYROIDAB in the last 72 hours. Anemia Panel: No results for input(s): VITAMINB12, FOLATE, FERRITIN, TIBC, IRON, RETICCTPCT in the last 72 hours. Urine analysis:    Component Value Date/Time   COLORURINE YELLOW 12/19/2016 [place] 12/19/2016 1229   LABSPEC 1.019 12/19/2016 1229  PHURINE 5.0 12/19/2016 1229   GLUCOSEU NEGATIVE 12/19/2016 1229   HGBUR SMALL (A) 12/19/2016 1229   BILIRUBINUR NEGATIVE 12/19/2016 1229   KETONESUR NEGATIVE 12/19/2016 1229   PROTEINUR NEGATIVE 12/19/2016 1229   UROBILINOGEN 0.2 07/18/2015 0108   NITRITE NEGATIVE 12/19/2016 1229   LEUKOCYTESUR NEGATIVE 12/19/2016 1229    Creatinine Clearance: CrCl cannot be calculated (Unknown ideal weight.).  Sepsis Labs: @LABRCNTIP (procalcitonin:4,lacticidven:4) )No results found for this or any previous visit (from the past 240 hour(s)).   Radiological Exams on Admission: Dg Chest 2 View  Result Date: 12/19/2016 CLINICAL DATA:  Cough.  Stroke symptoms with left-sided weakness EXAM: CHEST  2 VIEW COMPARISON:  07/18/2015 FINDINGS: Cardiac enlargement without heart failure. Negative for pneumonia or effusion. No mass lesion. Improved aeration compared with the prior study. Atherosclerotic aorta. IMPRESSION: Cardiac enlargement.  No acute abnormality. Electronically Signed   By: Franchot Gallo M.D.   On: 12/19/2016 13:13   Ct Head Wo Contrast  Result Date: 12/19/2016 CLINICAL DATA:  Left-sided weakness EXAM: CT HEAD WITHOUT CONTRAST TECHNIQUE: Contiguous axial images were obtained from the base of the skull through the vertex without intravenous contrast. COMPARISON:  07/05/2004 FINDINGS: Brain: No evidence of acute infarction, hemorrhage, hydrocephalus, extra-axial collection or mass lesion/mass effect. Vascular: No hyperdense  vessel or unexpected calcification. Skull: Normal. Negative for fracture or focal lesion. Sinuses/Orbits: No acute finding. Other: None. IMPRESSION: No acute intracranial abnormality noted. Electronically Signed   By: Inez Catalina M.D.   On: 12/19/2016 11:40    EKG: Independently reviewed. Sinus, no ACS  Assessment/Plan Active Problems:   Ischemic stroke [facility])   Essential hypertension   Hypothyroidism   Musculoskeletal pain   GERD (gastroesophageal reflux disease)   Seizure (H[facility])   Gout   Stroke: CT w/o ischemic or hemorrhagic changes noted. L extremities remain weak and L facial droop unchanged since onset. Neuro following. - Stroke orderset - f/u Neuro recs - MRI/MRA - Carotid Dopplers - Neuro checks - Echo - PT/OT/SLP - Likely to need CIR. Consult on 12/20/16 - Permissive HTN  HTN: Permissive HTN until 12/20/16 - hold coreg   Hypothyroid: - continue synthriod  MSK pain/strain: - continue robaxin, Norco  GERD: - continue PPI  Szr: last szr 1987 - continue depakote, zonigran - Depakote level  Gout: - continue Uloric   DVT prophylaxis: hep  Code Status: full  Family Communication: granddaughter   Disposition Plan: pending workup and likely transfer to [place] called: neuro  Admission status: inpt    Zennie Ayars J MD [facility]  If 7PM-7AM, please contact night-coverage [email or web address] Password [email or web address]  12/19/2016, 3:21 PM

## 2016-12-19 NOTE — ED Notes (Signed)
Speech therapy at bedside.

## 2016-12-20 ENCOUNTER — Inpatient Hospital Stay (HOSPITAL_COMMUNITY): Payer: Medicare Other

## 2016-12-20 DIAGNOSIS — G8194 Hemiplegia, unspecified affecting left nondominant side: Secondary | ICD-10-CM

## 2016-12-20 DIAGNOSIS — I639 Cerebral infarction, unspecified: Secondary | ICD-10-CM

## 2016-12-20 DIAGNOSIS — I6789 Other cerebrovascular disease: Secondary | ICD-10-CM

## 2016-12-20 DIAGNOSIS — R531 Weakness: Secondary | ICD-10-CM

## 2016-12-20 LAB — BASIC METABOLIC PANEL
Anion gap: 10 (ref 5–15)
BUN: 35 mg/dL — ABNORMAL HIGH (ref 6–20)
CALCIUM: 8.3 mg/dL — AB (ref 8.9–10.3)
CO2: 24 mmol/L (ref 22–32)
Chloride: 108 mmol/L (ref 101–111)
Creatinine, Ser: 1.25 mg/dL — ABNORMAL HIGH (ref 0.44–1.00)
GFR, EST AFRICAN AMERICAN: 47 mL/min — AB (ref >60)
GFR, EST NON AFRICAN AMERICAN: 40 mL/min — AB (ref >60)
Glucose, Bld: 73 mg/dL (ref 65–99)
Potassium: 4 mmol/L (ref 3.5–5.1)
SODIUM: 142 mmol/L (ref 135–145)

## 2016-12-20 LAB — ECHOCARDIOGRAM COMPLETE
Height: 69 in
Weight: 2576 oz

## 2016-12-20 LAB — CBC
HCT: 37.1 % (ref 36.0–46.0)
Hemoglobin: 11.9 g/dL — ABNORMAL LOW (ref 12.0–15.0)
MCH: 29.6 pg (ref 26.0–34.0)
MCHC: 32.1 g/dL (ref 30.0–36.0)
MCV: 92.3 fL (ref 78.0–100.0)
PLATELETS: 89 10*3/uL — AB (ref 150–400)
RBC: 4.02 MIL/uL (ref 3.87–5.11)
RDW: 13.8 % (ref 11.5–15.5)
WBC: 5.8 10*3/uL (ref 4.0–10.5)

## 2016-12-20 LAB — VAS US CAROTID
LCCADDIAS: -5 cm/s
LEFT ECA DIAS: -3 cm/s
LEFT VERTEBRAL DIAS: -6 cm/s
LICADSYS: -93 cm/s
LICAPSYS: -87 cm/s
Left CCA dist sys: -69 cm/s
Left CCA prox dias: 6 cm/s
Left CCA prox sys: 75 cm/s
Left ICA dist dias: -8 cm/s
Left ICA prox dias: -10 cm/s
RCCADSYS: -77 cm/s
RIGHT ECA DIAS: -2 cm/s
Right CCA prox dias: -11 cm/s
Right CCA prox sys: -82 cm/s

## 2016-12-20 LAB — LIPID PANEL
CHOLESTEROL: 110 mg/dL (ref 0–200)
HDL: 31 mg/dL — ABNORMAL LOW (ref >40)
LDL Cholesterol: 57 mg/dL (ref 0–99)
TRIGLYCERIDES: 108 mg/dL (ref <150)
Total CHOL/HDL Ratio: 3.5 RATIO
VLDL: 22 mg/dL (ref 0–40)

## 2016-12-20 LAB — GLUCOSE, CAPILLARY
GLUCOSE-CAPILLARY: 99 mg/dL (ref 65–99)
Glucose-Capillary: 102 mg/dL — ABNORMAL HIGH (ref 65–99)

## 2016-12-20 NOTE — Progress Notes (Signed)
STROKE TEAM PROGRESS NOTE   SUBJECTIVE (INTERVAL HISTORY) Her 2 daughters and fiance are at the bedside.  She is up working with PT. States she feels a little better, but still has her deficits.    OBJECTIVE Temp:  [95.1 F (35.1 C)-98.2 F (36.8 C)] 98.1 F (36.7 C) (03/23 0600) Pulse Rate:  [56-95] 56 (03/23 0600) Cardiac Rhythm: Normal sinus rhythm (03/23 0815) Resp:  [11-19] 18 (03/23 0600) BP: (122-177)/(39-123) 156/50 (03/23 0600) SpO2:  [94 %-100 %] 95 % (03/23 0600) Weight:  [73 kg (161 lb)] 73 kg (161 lb) (03/23 0249)  CBC:  Recent Labs Lab 12/19/16 1105 12/19/16 1125 12/20/16 0421  WBC 6.9  --  5.8  NEUTROABS 3.0  --   --   HGB 12.5 13.6 11.9*  HCT 39.2 40.0 37.1  MCV 92.0  --  92.3  PLT 90*  --  89*    Basic Metabolic Panel:  Recent Labs Lab 12/19/16 1105 12/19/16 1125 12/20/16 0421  NA 141 142 142  K 4.5 4.4 4.0  CL 109 110 108  CO2 24  --  24  GLUCOSE 79 81 73  BUN 43* 45* 35*  CREATININE 2.02* 2.10* 1.25*  CALCIUM 8.4*  --  8.3*   HgbA1c: No results found for: HGBA1C  PHYSICAL EXAM Pleasant elderly Caucasian lady not in distress.  . Afebrile. Head is nontraumatic. Neck is supple without bruit.    Cardiac exam no murmur or gallop. Lungs are clear to auscultation. Distal pulses are well felt. Neurological Exam : Awake alert oriented 3. Mild dysarthria but can be understood. Follows commands well. Extraocular moments are full range without nystagmus. Vision acuity and fields seem adequate. Mild left lower facial weakness. Tongue midline. Motor system exam reveals no drift but weakness in the left approximately predominantly affecting the hand grip and intrinsic hand muscles. Fine finger movements are absent in the left. Orbits right over left approximately. Symmetric lower extremity strength. Deep tendon reflexes are symmetric plantars are downgoing sensation is intact. Gait was not tested. ASSESSMENT/PLAN Ms. SHERRIKA WEAKLAND is a 76 y.o. female with  history of gout, hypertension, hypothyroidism, seizures presenting with L face, arm and leg weakness. She did not receive IV t-PA due to delay in arrival.    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 Old L occipital infarct. Atrophy. 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 Yes; Fluid consistency: Thin  No antithrombotic prior to admission, now on aspirin 325 mg daily  Therapy recommendations:  CIR  Disposition:  pending   Follow up Dr. Brett Fairy  Hypertension  Stable  Permissive hypertension (OK if < 220/120) but gradually normalize in 5-7 days  Long-term BP goal normotensive  Hyperlipidemia  Home meds:  pravachol 40, resumed in hospital  LDL 57 goal  Continue statin at discharge  Diabetes  HgbA1c goal < 7.0  Other Stroke Risk Factors  Advanced age  Former Cigarette smoker  UDS positive for opiates and amphetamines (on Theraflu Cold which has phenylephrine, which could be related)  Other Active Problems  Hypothyroid  MSK pain/strain  GERD  Seizures on Depakote, zonigran. Last sz 1987 -  followed by [facility] day # [place] for Pager information 12/20/2016 10:11 AM  I have personally examined this patient, reviewed notes, independently viewed imaging studies, participated in medical decision making and plan of care.ROS completed by me personally and pertinent positives fully documented  I have made any additions or clarifications directly to the above note. Agree with note above. She presented with left upper extremity weakness secondary to right brain subcortical lacunar infarct from small vessel disease. Recommend  ongoing stroke evaluation. Start aspirin for stroke prevention and aggressive risk factor modification. Discussed with patient and daughter and answered questions. Greater than 50% time during this 35 minute visit was spent on counseling and coordination of care about her lacunar stroke, risk of recurrence and discussion and answering questions. Discussed with Dr. Queen Slough, [place] Pager: [phone] 12/20/2016 3:39 PM  To contact Stroke Continuity provider, please refer to [email or web address] After hours, contact General Neurology

## 2016-12-20 NOTE — Evaluation (Signed)
Physical Therapy Evaluation Patient Details Name: Dana Harrington MRN: [id number] DOB: April 08, 1941 Today's Date: 12/20/2016   History of Present Illness  76 y.o.femalewith medical history significant of gout, hypertension, hypothyroidism, seizures presenting with left-sided weakness and facial droop. 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  Clinical Impression  Patient presents with decreased independence with mobility due to L side weakness, decreased sitting balance, premorbid LE pain and limited ROM, and generalized weakness.  She was previously living independent using RW and having a for ADL's.  Currently pt requires max to total A for all mobility.  Patient will benefit from skilled PT in the acute setting to allow d/c home following CIR level rehab stay.      Follow Up Recommendations CIR    Equipment Recommendations  Other (comment) (TBA)    Recommendations for Other Services Rehab consult     Precautions / Restrictions Precautions Precautions: Fall Restrictions Weight Bearing Restrictions: No      Mobility  Bed Mobility Overal bed mobility: Needs Assistance Bed Mobility: Rolling;Sidelying to Sit Rolling: Max assist;+2 for physical assistance Sidelying to sit: +2 for physical assistance;Total assist       General bed mobility comments: initiated with rolling using bed pad, placed pt's hand on railing and she was able to hold and push up some  Transfers Overall transfer level: Needs assistance   Transfers: Stand Pivot Transfers;Sit to/from Stand Sit to Stand: Max assist;+2 physical assistance Stand pivot transfers: +2 physical assistance;Max assist       General transfer comment: towards chair on R side, pt stood with +2 A to lift to stand, then pivoted on both feet with assist on both sides and cues to reach for chair with R UE  Ambulation/Gait                Stairs             Wheelchair Mobility    Modified Rankin (Stroke Patients Only) Modified Rankin (Stroke Patients Only) Pre-Morbid Rankin Score: Moderate disability Modified Rankin: Severe disability     Balance Overall balance assessment: Needs assistance Sitting-balance support: Feet supported;Single extremity supported Sitting balance-Leahy Scale: Poor Sitting balance - Comments: mod support most of session in sitting, able to sit for about 60 sec with S after cues and positioning forward and to R, sat EOB about 15 minutes with family in room, MD in to evaluate and speak with pt/family HW:[id number] and plan Postural control: Posterior lean;Left lateral lean Standing balance support: Bilateral upper extremity supported Standing balance-Leahy Scale: Zero Standing balance comment: +2 A to stand                             Pertinent Vitals/Pain Pain Assessment: 0-10 Pain Score: 5  Pain Location: legs Pain Descriptors / Indicators: Aching Pain Intervention(s): Repositioned;Monitored during session    Home Living Family/patient expects to be discharged to:: Private residence Living Arrangements: Alone   Type of Home: House Home Access: Stairs to enter Entrance Stairs-Rails: Right Entrance Stairs-Number of Steps: 4 Home Layout: One level Home Equipment: Grab bars - tub/shower;Bedside commode;Shower seat - built in;Hand held Tourist information centre manager - 2 wheels;Cane - single point;Cane - quad Additional Comments: daughter reports easier to bathe pt at her home due to set up    Prior Function Level of Independence: Needs assistance   Gait / Transfers Assistance Needed: uses walker and  cane  ADL's / Homemaking Assistance Needed: daughter helped with showering, pt did participate in laundry, toileted independent        Hand Dominance   Dominant Hand: Right    Extremity/Trunk Assessment   Upper Extremity Assessment Upper Extremity Assessment: Defer to OT evaluation     Lower Extremity Assessment Lower Extremity Assessment: LLE deficits/detail;RLE deficits/detail RLE Deficits / Details: AAROM limited knee flexion to about 50 degrees supine, strength grossly 3+/5 RLE: Unable to fully assess due to pain LLE Deficits / Details: AAROM limited knee flexion to about 30 degrees supine, strength grossly 3-/5 LLE: Unable to fully assess due to pain    Cervical / Trunk Assessment Cervical / Trunk Assessment: Kyphotic  Communication   Communication: Expressive difficulties  Cognition Arousal/Alertness: Awake/alert Behavior During Therapy: WFL for tasks assessed/performed Overall Cognitive Status: Impaired/Different from baseline Area of Impairment: Orientation;Following commands;Problem solving                 Orientation Level: Disoriented to;Situation;Time;Place (daughter reports pt unable to giver her DOB)     Following Commands: Follows one step commands with increased time     Problem Solving: Slow processing;Decreased initiation;Difficulty sequencing;Requires tactile cues;Requires verbal cues        General Comments      Exercises     Assessment/Plan    PT Assessment Patient needs continued PT services  PT Problem List Decreased strength;Decreased mobility;Decreased balance;Decreased cognition;Decreased activity tolerance       PT Treatment Interventions DME instruction;Therapeutic activities;Patient/family education;Therapeutic exercise;Gait training;Balance training;Functional mobility training;Neuromuscular re-education    PT Goals (Current goals can be found in the Care Plan section)  Acute Rehab PT Goals Patient Stated Goal: To get rehab then home PT Goal Formulation: With patient/family Time For Goal Achievement: 12/27/16 Potential to Achieve Goals: Fair    Frequency Min 4X/week   Barriers to discharge        Co-evaluation               End of Session Equipment Utilized During Treatment: Gait belt Activity  Tolerance: Patient limited by fatigue Patient left: in chair;with call bell/phone within reach;with family/visitor present Nurse Communication: Mobility status PT Visit Diagnosis: Hemiplegia and hemiparesis;Muscle weakness (generalized) (M62.81);Other abnormalities of gait and mobility (R26.89) Hemiplegia - Right/Left: Left Hemiplegia - dominant/non-dominant: Non-dominant Hemiplegia - caused by: Cerebral infarction    Time: 3159-4585 PT Time Calculation (min) (ACUTE ONLY): 38 min   Charges:   PT Evaluation $PT Eval Moderate Complexity: 1 Procedure     PT G CodesMagda Kiel, PT [phone] 12/20/2016   Reginia Naas 12/20/2016, 11:57 AM

## 2016-12-20 NOTE — Evaluation (Signed)
Speech Language Pathology Evaluation Patient Details Name: Dana Harrington MRN: [id number] DOB: 10-Sep-1941 Today's Date: 12/20/2016 Time: 9326-7124 SLP Time Calculation (min) (ACUTE ONLY): 22 min  Problem List:  Patient Active Problem List   Diagnosis Date Noted  . Stroke (cerebrum) ([place]) 12/19/2016  . Ischemic stroke ([place]) 12/19/2016  . Essential hypertension 12/19/2016  . Hypothyroidism 12/19/2016  . Musculoskeletal pain 12/19/2016  . GERD (gastroesophageal reflux disease) 12/19/2016  . Seizure ([place]) 12/19/2016  . Gout 12/19/2016   Past Medical History:  Past Medical History:  Diagnosis Date  . Aortic stenosis   . Arthritis    "qwhere" (12/19/2016)  . Chronic lower back pain   . Complication of anesthesia    "she can't be in a deep anesthesia; she's sleeps long after she's suppose to wake up" (12/19/2016)  . GERD (gastroesophageal reflux disease)   . Gout   . Hypertension   . Hypothyroidism   . LFT elevation   . Paronychia of fourth toe of right foot   . Seizure disorder ([place])    since age 49  . 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 Stroke ([place]) 12/19/2016   left arm and leg weakness, facial droop, slurred speech/notes 12/19/2016  . Vitamin D deficiency   . Zoster 02/2009   Past Surgical History:  Past Surgical History:  Procedure Laterality Date  . LAPAROSCOPIC CHOLECYSTECTOMY  11/2003  . TUBAL LIGATION  1970   HPI:  76 y.o.femalewith medical history significant of gout, hypertension, hypothyroidism, seizures presenting with left-sided weakness and facial droop. MRI showing right basal ganglia infarct and old left occiptal infarct.     Assessment / Plan / Recommendation Clinical Impression  Informal cognitive/linguistic and motor speech screening were completed.  The patient presented with a dysarthria that impeded intelligibility.  The patient presented with cognitive/linguistic deficits  characterized by difficulty answering complex yes/no questions, following complex commands, and with confrontational naming.  The patient was oriented to person, place and situation but disoriented to time.  She was able to solve simple problems.  Patient was easily distracted during evaluation and may have deficits in attention.  Immediate memory was very poor.  This will require further assessment.  She does live alone but has assistance from her daughter for medication and finance management.  She may required 24x7 supervision for at least the short term if [place] home.  She was able to functionally express wants/needs.  Given results of this evaluation recommend contined ST at next level of care with more in depth testing completed.      SLP Assessment  SLP Recommendation/Assessment: Patient needs continued Speech Lanaguage Pathology Services SLP Visit Diagnosis: Dysphagia, unspecified (R13.10)    Follow Up Recommendations  Other (comment) (continued ST at DC)    Frequency and Duration min 2x/week  2 weeks      SLP Evaluation Cognition  Overall Cognitive Status: Impaired/Different from baseline Arousal/Alertness: Awake/alert Orientation Level: Oriented to person;Oriented to place;Oriented to situation;Disoriented to time Attention: Sustained Sustained Attention: Appears intact Memory: Impaired Memory Impairment: Decreased recall of new information (even for immediate recall) Awareness: Appears intact Problem Solving: Appears intact       Comprehension  Auditory Comprehension Overall Auditory Comprehension: Impaired Yes/No Questions: Impaired Complex Questions: 50-74% accurate Commands: Impaired Complex Commands: 50-74% accurate Conversation: Simple    Expression Expression Primary Mode of Expression: Verbal Verbal Expression Overall Verbal Expression: Impaired Initiation: Impaired Automatic Speech: Name;Social Response Level  of Generative/Spontaneous Verbalization:  Phrase Naming: Impairment Confrontation: Impaired Non-Verbal Means of Communication: Not applicable Written Expression Dominant Hand: Right Written Expression: Not tested   Oral / Motor  Motor Speech Overall Motor Speech: Impaired Respiration: Within functional limits Phonation: Normal Resonance: Within functional limits Intelligibility: Intelligibility reduced Word: 75-100% accurate Phrase: 50-74% accurate Sentence: 50-74% accurate Motor Planning: Witnin functional limits Motor Speech Errors: Not applicable   GO                   Dana Flatten, MA, CCC-SLP Acute Rehab SLP [phone] Dana Harrington 12/20/2016, 12:25 PM

## 2016-12-20 NOTE — Evaluation (Signed)
Occupational Therapy Evaluation Patient Details Name: Dana Harrington MRN: [id number] DOB: 1940/12/27 Today's Date: 12/20/2016    History of Present Illness 76 y.o.femalewith medical history significant of gout, hypertension, hypothyroidism, seizures presenting with left-sided weakness and facial droop. 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   Clinical Impression   PTA Pt mod A for bathing, but mod I for other ADL and IADL. Pt used RW and cane for mobility. Pt currently max A for ADL and +2 max for stand pivot. Currently Pt has no strength or ROM of LUE. Pt will benefit from skilled OT in the acute setting to initiate rehab and begin neuro education/compensatory and safety strategies. Pt will require CIR level therapy to return to PLOF as well as provide caregiver education. Next session to focus on functional visual assessment in addition to LUE HEP/education.    Follow Up Recommendations  CIR    Equipment Recommendations  Other (comment) (defer to next venue)    Recommendations for Other Services Rehab consult     Precautions / Restrictions Precautions Precautions: Fall Restrictions Weight Bearing Restrictions: No      Mobility Bed Mobility Overal bed mobility: Needs Assistance Bed Mobility: Rolling;Sidelying to Sit Rolling: Max assist;+2 for physical assistance Sidelying to sit: +2 for physical assistance;Total assist       General bed mobility comments: initiated with rolling using bed pad, placed pt's hand on railing and she was able to hold and push up some  Transfers Overall transfer level: Needs assistance   Transfers: Stand Pivot Transfers;Sit to/from Stand Sit to Stand: Max assist;+2 physical assistance Stand pivot transfers: +2 physical assistance;Max assist       General transfer comment: towards chair on R side, pt stood with +2 A to lift to stand, then pivoted on both  feet with assist on both sides and cues to reach for chair with R UE    Balance Overall balance assessment: Needs assistance Sitting-balance support: Feet supported;Single extremity supported Sitting balance-Leahy Scale: Poor Sitting balance - Comments: mod support most of session in sitting, able to sit for about 60 sec with S after cues and positioning forward and to R, sat EOB about 15 minutes with family in room, MD in to evaluate and speak with pt/family CX:[id number] and plan Postural control: Posterior lean;Left lateral lean Standing balance support: Bilateral upper extremity supported Standing balance-Leahy Scale: Zero Standing balance comment: +2 A to stand                           ADL either performed or assessed with clinical judgement   ADL Overall ADL's : Needs assistance/impaired Eating/Feeding: Moderate assistance;Sitting   Grooming: Wash/dry face;Set up;Sitting Grooming Details (indicate cue type and reason): in recliner Upper Body Bathing: Maximal assistance;Sitting   Lower Body Bathing: Total assistance   Upper Body Dressing : Maximal assistance   Lower Body Dressing: Total assistance   Toilet Transfer: Maximal assistance;+2 for physical assistance;Stand-pivot;BSC Toilet Transfer Details (indicate cue type and reason): transfer to the right easier Toileting- Clothing Manipulation and Hygiene: Total assistance       Functional mobility during ADLs:  (stand pivot only this session) General ADL Comments: Pt's daughter present for session, who is her POA, and provides caregiver assistance     Vision Baseline Vision/History: Wears glasses Patient Visual Report: No change from baseline Vision Assessment?: Yes Eye Alignment: Within Functional Limits Ocular Range  of Motion: Within Functional Limits;Other (comment) Alignment/Gaze Preference: Within Defined Limits Tracking/Visual Pursuits: Requires cues, head turns, or add eye shifts to track;Unable to  hold eye position out of midline Saccades: Decreased speed of saccadic movement Convergence: Within functional limits Visual Fields: Other (comment) Depth Perception:  [facility]) Additional Comments: Pt has trouble following directions when testing visual abilities. At this time, unable to determine if there are visual deficits in the upper left quadrants or if it is attention, full ROM but unable to follow/track with consistency     Perception     Praxis      Pertinent Vitals/Pain Pain Assessment: No/denies pain Pain Score: 0-No pain Pain Location: legs Pain Descriptors / Indicators: Aching Pain Intervention(s): Monitored during session;Repositioned     Hand Dominance Right   Extremity/Trunk Assessment Upper Extremity Assessment Upper Extremity Assessment: RUE deficits/detail;Generalized weakness RUE Deficits / Details: uses shoulder to compensate movement, not able to grasp at all, no movement RUE Sensation:  (Pt reports WFL for touch test) RUE Coordination: decreased fine motor;decreased gross motor   Lower Extremity Assessment Lower Extremity Assessment: Defer to PT evaluation RLE Deficits / Details: AAROM limited knee flexion to about 50 degrees supine, strength grossly 3+/5 RLE: Unable to fully assess due to pain LLE Deficits / Details: AAROM limited knee flexion to about 30 degrees supine, strength grossly 3-/5 LLE: Unable to fully assess due to pain   Cervical / Trunk Assessment Cervical / Trunk Assessment: Kyphotic   Communication Communication Communication: Expressive difficulties   Cognition Arousal/Alertness: Awake/alert Behavior During Therapy: WFL for tasks assessed/performed Overall Cognitive Status: Impaired/Different from baseline Area of Impairment: Orientation;Following commands;Problem solving                 Orientation Level: Disoriented to;Situation;Time;Place (Pt states that Pt is unable to give her DOB at baseline)     Following Commands:  Follows one step commands with increased time     Problem Solving: Slow processing;Decreased initiation;Difficulty sequencing;Requires tactile cues;Requires verbal cues     General Comments  Pt's 2 daughters (and one of daughter's fiance) in room during session - want the best therapy for her but concerned about fatigue    Exercises     Shoulder Instructions      Home Living Family/patient expects to be discharged to:: Private residence Living Arrangements: Alone Available Help at Discharge: Family;Available 24 hours/day (Pt can live with Family if needed) Type of Home: House Home Access: Stairs to enter [employer] of Steps: 4 Entrance Stairs-Rails: Right Home Layout: One level     Bathroom Shower/Tub: Occupational psychologist: [place]: Grab bars - tub/shower;Bedside commode;Shower seat - built in;Hand held Tourist information centre manager - 2 wheels;Cane - single point;Cane - quad   Additional Comments: daughter reports easier to bathe pt at her home due to set up  Lives With: Alone    Prior Functioning/Environment Level of Independence: Needs assistance  Gait / Transfers Assistance Needed: uses walker and cane ADL's / Homemaking Assistance Needed: daughter helped with showering, pt did participate in laundry, toileted independent            OT Problem List: Decreased strength;Decreased range of motion;Decreased activity tolerance;Impaired balance (sitting and/or standing);Decreased coordination;Decreased cognition;Decreased safety awareness;Decreased knowledge of use of DME or AE;Impaired UE functional use      OT Treatment/Interventions: Self-care/ADL training;DME and/or AE instruction;Therapeutic exercise;Neuromuscular education;Energy conservation;Therapeutic activities;Cognitive remediation/compensation;Patient/family education;Balance training    OT Goals(Current goals can be found in the  care plan section) Acute Rehab OT  Goals Patient Stated Goal: To get rehab then home OT Goal Formulation: With patient/family Time For Goal Achievement: 01/03/17 Potential to Achieve Goals: Good ADL Goals Pt Will Perform Eating: with min guard assist;with caregiver independent in assisting;sitting Pt Will Perform Upper Body Bathing: with min guard assist;with caregiver independent in assisting;with adaptive equipment;sitting Pt Will Perform Lower Body Bathing: with mod assist;with caregiver independent in assisting;sitting/lateral leans Pt Will Transfer to Toilet: with mod assist;bedside commode;stand pivot transfer Pt Will Perform Toileting - Clothing Manipulation and hygiene: with min assist;sit to/from stand  OT Frequency: Min 3X/week   Barriers to D/C:    Pt lives alone, but it is possible for her to live with daughter after this       Co-evaluation PT/OT/SLP Co-Evaluation/Treatment: Yes Reason for Co-Treatment: Complexity of the patient's impairments (multi-system involvement);For patient/therapist safety;To address functional/ADL transfers   OT goals addressed during session: ADL's and self-care      End of Session Equipment Utilized During Treatment: Gait belt Nurse Communication: Mobility status;Precautions;Other (comment) (no chair alarm - RN and family aware)  Activity Tolerance: Patient tolerated treatment well Patient left: in chair;with call bell/phone within reach;with family/visitor present  OT Visit Diagnosis: Unsteadiness on feet (R26.81);Other abnormalities of gait and mobility (R26.89);Muscle weakness (generalized) (M62.81);Other symptoms and signs involving the nervous system (R29.898);Hemiplegia and hemiparesis Hemiplegia - Right/Left: Left Hemiplegia - dominant/non-dominant: Non-Dominant Hemiplegia - caused by: Cerebral infarction                Time: 4174-0814 OT Time Calculation (min): 38 min Charges:  OT General Charges $OT Visit: 1 Procedure OT Evaluation $OT Eval Moderate  Complexity: 1 Procedure G-Codes:    Hulda Humphrey OTR/L [place] 12/20/2016, 1:03 PM

## 2016-12-20 NOTE — Progress Notes (Signed)
PROGRESS NOTE   Dana Harrington  VQQ:[id number]    DOB: 12/30/1940    DOA: 12/19/2016  PCP: Jani Gravel, MD   I have briefly reviewed patients previous medical records in [facility].  Brief Narrative:  76 year old female, lives alone, ambulates with the help of a cane/walker, PMH of aortic stenosis, GERD, gout, HTN, hypothyroid, seizure disorder, arthritis of her knees and not a surgical candidate, presented to ED with facial droop, slurred speech and left-sided weakness which started sometime on the night prior to admission. Stroke confirmed. Neurology consulting.   Assessment & Plan:   Active Problems:   Ischemic stroke [facility])   Essential hypertension   Hypothyroidism   Musculoskeletal pain   GERD (gastroesophageal reflux disease)   Seizure (HCC)   Gout   Acute stroke: Right basal ganglia infarct secondary to small vessel disease. Resultant dysarthria, facial asymmetry and left hemiparesis (LUE weaker than LLE) CT head without acute abnormality MRI brain: Right basal ganglia lacunar infarct. 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 Atrophy. 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 MRA brain: Unremarkable. Carotid Doppler: Bilateral 1-39 percent ICA stenosis. Right vertebral artery could not be insonated. No color-flow or Doppler signals noted. Left vertebral artery flow is antegrade. 2-D echo: Pending LDL 57 Hemoglobin A1c: Pending Not on antithrombotic prior to admission, now on aspirin 325 MG daily for secondary stroke prophylaxis. Therapies recommend CIR. CIR consulted. Outpatient follow-up with neurology/Dr. 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 Holding carvedilol.  Hyperlipidemia LDL 57 and at goal. Continue Pravachol at home dose.  Seizure disorder Last seizure remote, 1987. On Depakote and Zonegran. Follows with  neurology.  Hypothyroid Continue Synthroid.  GERD Continue PPI  Bilateral knee arthritis - As per daughter, has been told that she is not a candidate for surgery.  Acute on stage III chronic kidney disease Presented with creatinine of 2.02. Baseline creatinine probably in the 1.4 range. Creatinine has improved to 1.25.  Anemia ? Dilutional. Follow CBC in a.m.  Thrombocytopenia, chronic Follow CBC in a.m.   DVT prophylaxis: Heparin Code Status: Full Family Communication: Discussed in detail with daughter at bedside Disposition: DC to CIR when medically improved and stroke workup completed, possibly over the weekend.   Consultants:  Neurology Rehabilitation M.D.   Procedures:  Carotid Doppler: Results as above  Antimicrobials:  None    Subjective: Continued slurred speech and left-sided weakness. Left upper extremity weaker than left lower extremity.   ROS: No chest pain or dyspnea reported.  Objective:  Vitals:   12/20/16 0249 12/20/16 0400 12/20/16 0600 12/20/16 0800  BP:  (!) 144/71 (!) 156/50 (!) 154/60  Pulse:  (!) 56 (!) 56 65  Resp:  18 18 16   Temp:  98.2 F (36.8 C) 98.1 F (36.7 C)   TempSrc:  Oral Oral   SpO2:  97% 95% 99%  Weight: 73 kg (161 lb)     Height: 5\' 9"  (1.753 m)       Examination:  General exam: Pleasant elderly female lying comfortably propped up in bed. Respiratory system: Clear to auscultation. Respiratory effort normal. Cardiovascular system: S1 & S2 heard, RRR. No JVD, murmurs, rubs, gallops or clicks. No pedal edema. Telemetry: SB in the 50s-SR Gastrointestinal system: Abdomen is nondistended, soft and nontender. No organomegaly or masses felt. Normal bowel sounds heard. Central nervous system: Alert and oriented 3. Facial asymmetry +. Dysarthria +. No  other cranial nerve deficits. Extremities: 5 x 5 power in right extremities. 0 x 5 power in left upper extremity and 2 x 5 power in left lower extremity Skin: No rashes,  lesions or ulcers Psychiatry: Judgement and insight appear normal. Mood & affect appropriate.     Data Reviewed: I have personally reviewed following labs and imaging studies  CBC:  Recent Labs Lab 12/19/16 1105 12/19/16 1125 12/20/16 0421  WBC 6.9  --  5.8  NEUTROABS 3.0  --   --   HGB 12.5 13.6 11.9*  HCT 39.2 40.0 37.1  MCV 92.0  --  92.3  PLT 90*  --  89*   Basic Metabolic Panel:  Recent Labs Lab 12/19/16 1105 12/19/16 1125 12/20/16 0421  NA 141 142 142  K 4.5 4.4 4.0  CL 109 110 108  CO2 24  --  24  GLUCOSE 79 81 73  BUN 43* 45* 35*  CREATININE 2.02* 2.10* 1.25*  CALCIUM 8.4*  --  8.3*   Liver Function Tests:  Recent Labs Lab 12/19/16 1105  AST 30  ALT 13*  ALKPHOS 63  BILITOT 0.9  PROT 6.7  ALBUMIN 2.7*   Coagulation Profile:  Recent Labs Lab 12/19/16 1105  INR 1.10   Cardiac Enzymes: No results for input(s): CKTOTAL, CKMB, CKMBINDEX, TROPONINI in the last 168 hours. HbA1C: No results for input(s): HGBA1C in the last 72 hours. CBG:  Recent Labs Lab 12/20/16 1139  GLUCAP 99    No results found for this or any previous visit (from the past 240 hour(s)).       Radiology Studies: Dg Chest 2 View  Result Date: 12/19/2016 CLINICAL DATA:  Cough.  Stroke symptoms with left-sided weakness EXAM: CHEST  2 VIEW COMPARISON:  07/18/2015 FINDINGS: Cardiac enlargement without heart failure. Negative for pneumonia or effusion. No mass lesion. Improved aeration compared with the prior study. Atherosclerotic aorta. IMPRESSION: Cardiac enlargement.  No acute abnormality. Electronically Signed   By: Franchot Gallo M.D.   On: 12/19/2016 13:13   Ct Head Wo Contrast  Result Date: 12/19/2016 CLINICAL DATA:  Left-sided weakness EXAM: CT HEAD WITHOUT CONTRAST TECHNIQUE: Contiguous axial images were obtained from the base of the skull through the vertex without intravenous contrast. COMPARISON:  07/05/2004 FINDINGS: Brain: No evidence of acute infarction,  hemorrhage, hydrocephalus, extra-axial collection or mass lesion/mass effect. Vascular: No hyperdense vessel or unexpected calcification. Skull: Normal. Negative for fracture or focal lesion. Sinuses/Orbits: No acute finding. Other: None. IMPRESSION: No acute intracranial abnormality noted. Electronically Signed   By: Inez Catalina M.D.   On: 12/19/2016 11:40   Mr Brain Wo Contrast  Result Date: 12/19/2016 CLINICAL DATA:  Initial evaluation for acute left-sided weakness, facial droop. EXAM: MRI HEAD WITHOUT CONTRAST MRA HEAD WITHOUT CONTRAST TECHNIQUE: Multiplanar, multiecho pulse sequences of the brain and surrounding structures were obtained without intravenous contrast. Angiographic images of the head were obtained using MRA technique without contrast. COMPARISON:  Prior CT from earlier same day. FINDINGS: MRI HEAD FINDINGS Brain: Diffuse prominence of the CSF containing spaces is compatible with generalized age-related cerebral atrophy. No significant cerebral white matter disease for patient age. Focal encephalomalacia with gliosis within the left occipital lobe compatible with remote infarct. Small curvilinear focus of restricted diffusion extends from the posterior aspect of the right lentiform nucleus towards the right corona radiata, compatible with a small acute lacunar infarct (series 4, image 28). Infarct measures 19 mm in length. No associated hemorrhage or mass effect. No other evidence for  acute or subacute ischemia. No mass lesion, midline shift, or mass effect. Mild ventricular prominence related to global parenchymal volume loss without hydrocephalus. No extra-axial fluid collection. Major dural sinuses are grossly patent. Pituitary gland within normal limits. Vascular: Major intracranial vascular flow voids maintained. Skull and upper cervical spine: Mild degenerative ligamentous thickening noted at the craniocervical junction. Visualized upper cervical spine otherwise unremarkable. Bone marrow  signal intensity within normal limits. No scalp soft tissue abnormality. Sinuses/Orbits: Globes and oval soft tissues within normal limits. Scattered mucosal thickening throughout the paranasal sinuses, greatest within the ethmoid air cells. No mastoid effusion. Inner ear structures normal. Other: 2.8 cm well-circumscribed T2 hyperintense mass noted within the left parotid gland, incompletely visualized. MRA HEAD FINDINGS ANTERIOR CIRCULATION: Distal cervical segments of the internal carotid arteries are patent with antegrade flow. Petrous, cavernous, supraclinoid segments widely patent without flow-limiting stenosis. Dominant right A1 segment widely patent. Left A1 segment hypoplastic but patent. This likely accounts for the slightly diminutive left ICA is compared to the right. Anterior communicating artery normal. Anterior cerebral arteries widely patent to their distal aspects. M1 segments widely patent without stenosis or occlusion. Distal MCA branches well opacified and symmetric. POSTERIOR CIRCULATION: Left vertebral artery dominant and widely patent to the vertebrobasilar junction. Right vertebral artery hypoplastic but grossly patent as well. Posterior inferior cerebral arteries patent proximally. Basilar artery widely patent. Superior cerebellar and posterior cerebral arteries widely patent bilaterally. No aneurysm or vascular malformation. IMPRESSION: MRI HEAD IMPRESSION: 1. 19 mm acute nonhemorrhagic lacunar infarct involving the right basal ganglia. No associated mass effect. 2. Remote left occipital lobe infarct. 3. Generalized age-related cerebral atrophy. 4. 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 Nonemergent ENT consultation suggested. MRA HEAD IMPRESSION: Negative intracranial MRA. No large vessel occlusion. No high-grade or correctable stenosis. Electronically Signed   By: Jeannine Boga M.D.   On: 12/19/2016 17:59   Mr Jodene Nam Headm  Result Date: 12/19/2016 CLINICAL DATA:   Initial evaluation for acute left-sided weakness, facial droop. EXAM: MRI HEAD WITHOUT CONTRAST MRA HEAD WITHOUT CONTRAST TECHNIQUE: Multiplanar, multiecho pulse sequences of the brain and surrounding structures were obtained without intravenous contrast. Angiographic images of the head were obtained using MRA technique without contrast. COMPARISON:  Prior CT from earlier same day. FINDINGS: MRI HEAD FINDINGS Brain: Diffuse prominence of the CSF containing spaces is compatible with generalized age-related cerebral atrophy. No significant cerebral white matter disease for patient age. Focal encephalomalacia with gliosis within the left occipital lobe compatible with remote infarct. Small curvilinear focus of restricted diffusion extends from the posterior aspect of the right lentiform nucleus towards the right corona radiata, compatible with a small acute lacunar infarct (series 4, image 28). Infarct measures 19 mm in length. No associated hemorrhage or mass effect. No other evidence for acute or subacute ischemia. No mass lesion, midline shift, or mass effect. Mild ventricular prominence related to global parenchymal volume loss without hydrocephalus. No extra-axial fluid collection. Major dural sinuses are grossly patent. Pituitary gland within normal limits. Vascular: Major intracranial vascular flow voids maintained. Skull and upper cervical spine: Mild degenerative ligamentous thickening noted at the craniocervical junction. Visualized upper cervical spine otherwise unremarkable. Bone marrow signal intensity within normal limits. No scalp soft tissue abnormality. Sinuses/Orbits: Globes and oval soft tissues within normal limits. Scattered mucosal thickening throughout the paranasal sinuses, greatest within the ethmoid air cells. No mastoid effusion. Inner ear structures normal. Other: 2.8 cm well-circumscribed T2 hyperintense mass noted within the left parotid gland, incompletely visualized. MRA HEAD  FINDINGS  ANTERIOR CIRCULATION: Distal cervical segments of the internal carotid arteries are patent with antegrade flow. Petrous, cavernous, supraclinoid segments widely patent without flow-limiting stenosis. Dominant right A1 segment widely patent. Left A1 segment hypoplastic but patent. This likely accounts for the slightly diminutive left ICA is compared to the right. Anterior communicating artery normal. Anterior cerebral arteries widely patent to their distal aspects. M1 segments widely patent without stenosis or occlusion. Distal MCA branches well opacified and symmetric. POSTERIOR CIRCULATION: Left vertebral artery dominant and widely patent to the vertebrobasilar junction. Right vertebral artery hypoplastic but grossly patent as well. Posterior inferior cerebral arteries patent proximally. Basilar artery widely patent. Superior cerebellar and posterior cerebral arteries widely patent bilaterally. No aneurysm or vascular malformation. IMPRESSION: MRI HEAD IMPRESSION: 1. 19 mm acute nonhemorrhagic lacunar infarct involving the right basal ganglia. No associated mass effect. 2. Remote left occipital lobe infarct. 3. Generalized age-related cerebral atrophy. 4. 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 Nonemergent ENT consultation suggested. MRA HEAD IMPRESSION: Negative intracranial MRA. No large vessel occlusion. No high-grade or correctable stenosis. Electronically Signed   By: Jeannine Boga M.D.   On: 12/19/2016 17:59        Scheduled Meds: .  stroke: mapping our early stages of recovery book   Does not apply Once  . aspirin  300 mg Rectal Daily   Or  . aspirin  325 mg Oral Daily  . divalproex  500 mg Oral BID  . febuxostat  40 mg Oral Daily  . heparin  5,000 Units Subcutaneous Q8H  . levothyroxine  100 mcg Oral QAC breakfast  . pantoprazole  80 mg Oral Daily  . pravastatin  40 mg Oral q1800  . sucralfate  1 g Oral BID  . zonisamide  100 mg Oral BID   Continuous Infusions: .  sodium chloride       LOS: 1 day     Caulder Wehner, MD, FACP, FHM. [facility] Pager [phone]  If 7PM-7AM, please contact night-coverage [email or web address] Password [email or web address] 12/20/2016, 3:00 PM

## 2016-12-20 NOTE — Progress Notes (Signed)
  Speech Language Pathology Treatment: Dysphagia  Patient Details Name: Dana Harrington MRN: [id number] DOB: [birth year] Today's Date: 12/20/2016 Time: 4765-4650 SLP Time Calculation (min) (ACUTE ONLY): 15 min  Assessment / Plan / Recommendation Clinical Impression  ST follow up for therapeutic diet tolerance.  Chart review indicated that the patient has been afebrile and breathing has been regular and unlabored.  Patient has had a productive cough which the daughter reported since prior to admission.  Meal observation was completed and the patient was mildly slow to chew dual textured material but swallow trigger did appear to be timely.  Cough x1 was seen with thin liquids.  Anterior escape was not seen and the patient appeared to be able to easily clear oral cavity.  Recommend continue with dysphagia 2 diet with thin liquids - no straws.  ST will continue to follow.     HPI HPI: 76 y.o.femalewith medical history significant of gout, hypertension, hypothyroidism, seizures presenting with left-sided weakness and facial droop. MRI pending.      SLP Plan  Continue with current plan of care       Recommendations  Diet recommendations: Dysphagia 2 (fine chop);Thin liquid Liquids provided via: Cup;No straw Medication Administration: Whole meds with puree Supervision: Patient able to self feed Compensations: Slow rate;Small sips/bites;Monitor for anterior loss Postural Changes and/or Swallow Maneuvers: Seated upright 90 degrees                Oral Care Recommendations: Oral care BID Follow up Recommendations: Other (comment) (f/u ST) SLP Visit Diagnosis: Dysphagia, unspecified (R13.10) Plan: Continue with current plan of care       [place], [place], CCC-SLP Acute Rehab SLP [phone] Lamar Sprinkles 12/20/2016, 12:10 PM

## 2016-12-20 NOTE — Progress Notes (Signed)
VASCULAR LAB PRELIMINARY  PRELIMINARY  PRELIMINARY  PRELIMINARY  Carotid duplex completed.    Preliminary report: Bilateral: 1-39% ICA stenosis.  Right Vertebral could not be insonated. No color flow or Doppler signal noted. Left vertebral artery flow is antegrade.Marland Kitchen     Jozlyn Schatz, RVS 12/20/2016, 1:18 PM

## 2016-12-20 NOTE — Progress Notes (Signed)
Echocardiogram 2D Echocardiogram has been performed.  Dana Harrington 12/20/2016, 1:44 PM

## 2016-12-20 NOTE — Care Management Note (Addendum)
Case Management Note  Patient Details  Name: Dana Harrington MRN: [id number] Date of Birth: April 27, 1941  Subjective/Objective:    Pt admitted with CVA. She is from home alone.  3/24 Addendum D Lillyan Hitson RN CM Spoke with Dr Algis Liming, stroke workup still in process, anticipate DC ready Monday, CIR vs SNF.                  Action/Plan: PT/OT recommending CIR. CM following for d/c disposition.   Expected Discharge Date:                  Expected Discharge Plan:  [place]  In-House Referral:     Discharge planning Services     Post Acute Care Choice:    Choice offered to:     DME Arranged:    DME Agency:     HH Arranged:    [place] Agency:     Status of Service:  In process, will continue to follow  If discussed at Long Length of Stay Meetings, dates discussed:    Additional Comments:  Pollie Friar, RN 12/20/2016, 1:24 PM

## 2016-12-20 NOTE — Consult Note (Signed)
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 right handed female with history of hypertension, seizure disorder on Depakote. Presented 12/19/2016 with left-sided weakness, slurred speech and facial droop. Patient lives alone idependent with a cane prior to admission. One level home. Family assistance as needed. CT/MRI showed nonhemorrhagic lacunar infarct right basal ganglia. Remote left occipital lobe infarct. MRA negative for large vessel occlusion. Patient did not receive TPA. Neurology consulted with workup ongoing. Echocardiogram pending. Currently on aspirin for CVA prophylaxis. Subcutaneous heparin for DVT prophylaxis. Currently on a dysphagia #2 thin liquid diet. Physical and occupational therapy evaluations pending. M.D. has requested physical medicine rehabilitation consult.   Review of Systems  Constitutional: Negative for chills and fever.  HENT: Negative for hearing loss.   Eyes: Negative for blurred vision and double vision.  Respiratory: Positive for cough. Negative for shortness of breath.   Cardiovascular: Positive for leg swelling. Negative for chest pain and palpitations.  Gastrointestinal: Positive for constipation. Negative for nausea and vomiting.       GERD  Genitourinary: Negative for dysuria and hematuria.  Musculoskeletal: Positive for back pain, joint pain and myalgias.  Skin: Negative for rash.  Neurological: Positive for speech change, seizures and weakness.  All other systems reviewed and are negative.  Past Medical History:  Diagnosis Date  . Aortic stenosis   . Arthritis    "qwhere" (12/19/2016)  . Chronic lower back pain   . Complication of anesthesia    "she can't be in a deep anesthesia; she's sleeps long after she's suppose to wake up" (12/19/2016)  . GERD (gastroesophageal reflux disease)   . Gout    . Hypertension   . Hypothyroidism   . LFT elevation   . Paronychia of fourth toe of right foot   . Seizure disorder ([place])    since age 29  . 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 Stroke ([place]) 12/19/2016   left arm and leg weakness, facial droop, slurred speech/notes 12/19/2016  . Vitamin D deficiency   . Zoster 02/2009   Past Surgical History:  Procedure Laterality Date  . LAPAROSCOPIC CHOLECYSTECTOMY  11/2003  . TUBAL LIGATION  1970   Family History  Problem Relation Age of Onset  . Heart disease Father   . Seizures Mother   . Dementia Brother   . Sleep disorder Sister    Social History:  reports that she has never smoked. She has quit using smokeless tobacco. She reports that she does not drink alcohol or use drugs. Allergies:  Allergies  Allergen Reactions  . Gabapentin Other (See Comments)    "Bouncing"   . Penicillins Rash   Medications Prior to Admission  Medication Sig Dispense Refill  . acetaminophen (TYLENOL) 325 MG tablet Take 325 mg by mouth every 6 (six) hours as needed for pain.    . carvedilol (COREG) 12.5 MG tablet Take 12.5 mg by mouth 2 (two) times daily with a meal.    . divalproex (DEPAKOTE) 500 MG DR tablet Take 500 mg by mouth 2 (two) times daily.    . febuxostat (ULORIC) 40 MG tablet Take 40 mg by mouth daily.    Marland Kitchen HYDROcodone-acetaminophen (NORCO/VICODIN) 5-325 MG per tablet Take 1 tablet by mouth 2 (two) times daily as needed for pain.    Marland Kitchen  ibuprofen (ADVIL,MOTRIN) 200 MG tablet Take 200 mg by mouth every 6 (six) hours as needed for moderate pain.     Marland Kitchen levothyroxine (SYNTHROID, LEVOTHROID) 100 MCG tablet Take 100 mcg by mouth daily before breakfast.    . loratadine-pseudoephedrine (CLARITIN-D 24-HOUR) 10-240 MG 24 hr tablet Take 1 tablet by mouth daily.    . methocarbamol (ROBAXIN) 500 MG tablet Take 500 mg by mouth every 6 (six) hours as needed for muscle spasms.  2  . omeprazole  (PRILOSEC) 40 MG capsule Take 1 capsule by mouth 2 (two) times daily.   3  . pravastatin (PRAVACHOL) 40 MG tablet Take 40 mg by mouth daily.    . sucralfate (CARAFATE) 1 G tablet Take 1 tablet by mouth 2 (two) times daily.  3  . Vitamin D, Ergocalciferol, (DRISDOL) 50000 units CAPS capsule Take 50,000 Units by mouth once a week.  12  . zonisamide (ZONEGRAN) 100 MG capsule Take 100 mg by mouth 2 (two) times daily.       Home: Home Living Family/patient expects to be discharged to:: Private residence Living Arrangements: Alone Type of Home: House Home Access: Stairs to enter Technical brewer of Steps: 4 Entrance Stairs-Rails: Right Home Layout: One level Bathroom Shower/Tub: Multimedia programmer: [place]: Grab bars - tub/shower, Bedside commode, Shower seat - built in, Engineer, manufacturing held shower head, Environmental consultant - 2 wheels, [place] - single point, [employer] - quad Additional Comments: daughter reports easier to bathe pt at her home due to set up  Functional History: Prior Function Level of Independence: Needs assistance Gait / Transfers Assistance Needed: uses walker and cane ADL's / Homemaking Assistance Needed: daughter helped with showering, pt did participate in laundry, toileted independent Functional Status:  Mobility:          ADL:    Cognition: Cognition Overall Cognitive Status: Impaired/Different from baseline Orientation Level: Oriented to person, Oriented to place, Oriented to situation Cognition Arousal/Alertness: Awake/alert Behavior During Therapy: WFL for tasks assessed/performed Overall Cognitive Status: Impaired/Different from baseline  Blood pressure (!) 156/50, pulse (!) 56, temperature 98.1 F (36.7 C), temperature source Oral, resp. rate 18, height 5\' 9"  (1.753 m), weight 73 kg (161 lb), SpO2 95 %. Physical Exam  Vitals reviewed. Constitutional: She is oriented to person, place, and time.  HENT:  Left facial droop  Eyes: EOM are normal.   Neck: Normal range of motion. Neck supple. No thyromegaly present.  Cardiovascular: Normal rate and regular rhythm.   Respiratory: Effort normal and breath sounds normal.  GI: Soft. Bowel sounds are normal. She exhibits no distension.  Musculoskeletal:  Bilateral knee effusions and pain with PROM. 2+ edema bilateral distal LE  Neurological: She is alert and oriented to person, place, and time.  Pt is alert sitting in recliner. Left central 7 and tongue deviation. Speech very dysarthric. Cognitively displays reasonable insight and awareness. Sensation 1+/2 LUE and LLE. .LUE/LE Motor:  0/5 deltoid, 0/5 bicep, 0/5 tricep, 0/5 wrist extension, 0/5 hand intrinsics.      1/5 hip flexor, 1/5 knee extension, 1/5 ankle dorsiflexion, 1/5 ankle plantarflexion. RUE/RLE motor grossly 4/5 except for ke/hf which was 2+ to 3/5 and limited by knee pain.   Skin: Skin is warm and dry.  Psychiatric: She has a normal mood and affect.    Results for orders placed or performed during the hospital encounter of 12/19/16 (from the past 24 hour(s))  Ethanol     Status: None   Collection Time: 12/19/16 11:05 AM  Result Value Ref Range   Alcohol, Ethyl (B) <5 <5 mg/dL  Protime-INR     Status: None   Collection Time: 12/19/16 11:05 AM  Result Value Ref Range   Prothrombin Time 14.3 11.4 - 15.2 seconds   INR 1.10   APTT     Status: None   Collection Time: 12/19/16 11:05 AM  Result Value Ref Range   aPTT 33 24 - 36 seconds  CBC     Status: Abnormal   Collection Time: 12/19/16 11:05 AM  Result Value Ref Range   WBC 6.9 4.0 - 10.5 K/uL   RBC 4.26 3.87 - 5.11 MIL/uL   Hemoglobin 12.5 12.0 - 15.0 g/dL   HCT 39.2 36.0 - 46.0 %   MCV 92.0 78.0 - 100.0 fL   MCH 29.3 26.0 - 34.0 pg   MCHC 31.9 30.0 - 36.0 g/dL   RDW 13.7 11.5 - 15.5 %   Platelets 90 (L) 150 - 400 K/uL  Differential     Status: Abnormal   Collection Time: 12/19/16 11:05 AM  Result Value Ref Range   Neutrophils Relative % 44 %   Neutro Abs 3.0  1.7 - 7.7 K/uL   Lymphocytes Relative 29 %   Lymphs Abs 2.0 0.7 - 4.0 K/uL   Monocytes Relative 18 %   Monocytes Absolute 1.2 (H) 0.1 - 1.0 K/uL   Eosinophils Relative 9 %   Eosinophils Absolute 0.6 0.0 - 0.7 K/uL   Basophils Relative 1 %   Basophils Absolute 0.1 0.0 - 0.1 K/uL  Comprehensive metabolic panel     Status: Abnormal   Collection Time: 12/19/16 11:05 AM  Result Value Ref Range   Sodium 141 135 - 145 mmol/L   Potassium 4.5 3.5 - 5.1 mmol/L   Chloride 109 101 - 111 mmol/L   CO2 24 22 - 32 mmol/L   Glucose, Bld 79 65 - 99 mg/dL   BUN 43 (H) 6 - 20 mg/dL   Creatinine, Ser 2.02 (H) 0.44 - 1.00 mg/dL   Calcium 8.4 (L) 8.9 - 10.3 mg/dL   Total Protein 6.7 6.5 - 8.1 g/dL   Albumin 2.7 (L) 3.5 - 5.0 g/dL   AST 30 15 - 41 U/L   ALT 13 (L) 14 - 54 U/L   Alkaline Phosphatase 63 38 - 126 U/L   Total Bilirubin 0.9 0.3 - 1.2 mg/dL   GFR calc non Af Amer 23 (L) >60 mL/min   GFR calc Af Amer 26 (L) >60 mL/min   Anion gap 8 5 - 15  I-stat troponin, ED (not at [facility], [facility])     Status: None   Collection Time: 12/19/16 11:23 AM  Result Value Ref Range   Troponin i, poc 0.01 0.00 - 0.08 ng/mL   Comment 3          I-Stat Chem 8, ED  (not at [facility], [facility])     Status: Abnormal   Collection Time: 12/19/16 11:25 AM  Result Value Ref Range   Sodium 142 135 - 145 mmol/L   Potassium 4.4 3.5 - 5.1 mmol/L   Chloride 110 101 - 111 mmol/L   BUN 45 (H) 6 - 20 mg/dL   Creatinine, Ser 2.10 (H) 0.44 - 1.00 mg/dL   Glucose, Bld 81 65 - 99 mg/dL   Calcium, Ion 1.13 (L) 1.15 - 1.40 mmol/L   TCO2 23 0 - 100 mmol/L   Hemoglobin 13.6 12.0 - 15.0 g/dL   HCT 40.0 36.0 - 46.0 %  Urine rapid drug screen (hosp performed)not at [facility]     Status: Abnormal   Collection Time: 12/19/16 12:29 PM  Result Value Ref Range   Opiates POSITIVE (A) NONE DETECTED   Cocaine NONE DETECTED NONE DETECTED   Benzodiazepines NONE DETECTED NONE DETECTED   Amphetamines POSITIVE (A) NONE DETECTED   Tetrahydrocannabinol NONE  DETECTED NONE DETECTED   Barbiturates NONE DETECTED NONE DETECTED  Urinalysis, Routine w reflex microscopic     Status: Abnormal   Collection Time: 12/19/16 12:29 PM  Result Value Ref Range   Color, Urine YELLOW YELLOW   APPearance CLEAR CLEAR   Specific Gravity, Urine 1.019 1.005 - 1.030   pH 5.0 5.0 - 8.0   Glucose, UA NEGATIVE NEGATIVE mg/dL   Hgb urine dipstick SMALL (A) NEGATIVE   Bilirubin Urine NEGATIVE NEGATIVE   Ketones, ur NEGATIVE NEGATIVE mg/dL   Protein, ur NEGATIVE NEGATIVE mg/dL   Nitrite NEGATIVE NEGATIVE   Leukocytes, UA NEGATIVE NEGATIVE   RBC / HPF 0-5 0 - 5 RBC/hpf   WBC, UA 0-5 0 - 5 WBC/hpf   Bacteria, UA NONE SEEN NONE SEEN   Squamous Epithelial / LPF 0-5 (A) NONE SEEN   Mucous PRESENT    Hyaline Casts, UA PRESENT   TSH     Status: None   Collection Time: 12/19/16  5:44 PM  Result Value Ref Range   TSH 1.245 0.350 - 4.500 uIU/mL  T4, free     Status: Abnormal   Collection Time: 12/19/16  5:44 PM  Result Value Ref Range   Free T4 1.30 (H) 0.61 - 1.12 ng/dL  Lipid panel     Status: Abnormal   Collection Time: 12/20/16  4:21 AM  Result Value Ref Range   Cholesterol 110 0 - 200 mg/dL   Triglycerides 108 <150 mg/dL   HDL 31 (L) >40 mg/dL   Total CHOL/HDL Ratio 3.5 RATIO   VLDL 22 0 - 40 mg/dL   LDL Cholesterol 57 0 - 99 mg/dL  CBC     Status: Abnormal   Collection Time: 12/20/16  4:21 AM  Result Value Ref Range   WBC 5.8 4.0 - 10.5 K/uL   RBC 4.02 3.87 - 5.11 MIL/uL   Hemoglobin 11.9 (L) 12.0 - 15.0 g/dL   HCT 37.1 36.0 - 46.0 %   MCV 92.3 78.0 - 100.0 fL   MCH 29.6 26.0 - 34.0 pg   MCHC 32.1 30.0 - 36.0 g/dL   RDW 13.8 11.5 - 15.5 %   Platelets 89 (L) 150 - 400 K/uL  Basic metabolic panel     Status: Abnormal   Collection Time: 12/20/16  4:21 AM  Result Value Ref Range   Sodium 142 135 - 145 mmol/L   Potassium 4.0 3.5 - 5.1 mmol/L   Chloride 108 101 - 111 mmol/L   CO2 24 22 - 32 mmol/L   Glucose, Bld 73 65 - 99 mg/dL   BUN 35 (H) 6 - 20  mg/dL   Creatinine, Ser 1.25 (H) 0.44 - 1.00 mg/dL   Calcium 8.3 (L) 8.9 - 10.3 mg/dL   GFR calc non Af Amer 40 (L) >60 mL/min   GFR calc Af Amer 47 (L) >60 mL/min   Anion gap 10 5 - 15   Dg Chest 2 View  Result Date: 12/19/2016 CLINICAL DATA:  Cough.  Stroke symptoms with left-sided weakness EXAM: CHEST  2 VIEW COMPARISON:  07/18/2015 FINDINGS: Cardiac enlargement without heart failure. Negative for pneumonia or effusion. No  mass lesion. Improved aeration compared with the prior study. Atherosclerotic aorta. IMPRESSION: Cardiac enlargement.  No acute abnormality. Electronically Signed   By: Franchot Gallo M.D.   On: 12/19/2016 13:13   Ct Head Wo Contrast  Result Date: 12/19/2016 CLINICAL DATA:  Left-sided weakness EXAM: CT HEAD WITHOUT CONTRAST TECHNIQUE: Contiguous axial images were obtained from the base of the skull through the vertex without intravenous contrast. COMPARISON:  07/05/2004 FINDINGS: Brain: No evidence of acute infarction, hemorrhage, hydrocephalus, extra-axial collection or mass lesion/mass effect. Vascular: No hyperdense vessel or unexpected calcification. Skull: Normal. Negative for fracture or focal lesion. Sinuses/Orbits: No acute finding. Other: None. IMPRESSION: No acute intracranial abnormality noted. Electronically Signed   By: Inez Catalina M.D.   On: 12/19/2016 11:40   Mr Brain Wo Contrast  Result Date: 12/19/2016 CLINICAL DATA:  Initial evaluation for acute left-sided weakness, facial droop. EXAM: MRI HEAD WITHOUT CONTRAST MRA HEAD WITHOUT CONTRAST TECHNIQUE: Multiplanar, multiecho pulse sequences of the brain and surrounding structures were obtained without intravenous contrast. Angiographic images of the head were obtained using MRA technique without contrast. COMPARISON:  Prior CT from earlier same day. FINDINGS: MRI HEAD FINDINGS Brain: Diffuse prominence of the CSF containing spaces is compatible with generalized age-related cerebral atrophy. No significant cerebral  white matter disease for patient age. Focal encephalomalacia with gliosis within the left occipital lobe compatible with remote infarct. Small curvilinear focus of restricted diffusion extends from the posterior aspect of the right lentiform nucleus towards the right corona radiata, compatible with a small acute lacunar infarct (series 4, image 28). Infarct measures 19 mm in length. No associated hemorrhage or mass effect. No other evidence for acute or subacute ischemia. No mass lesion, midline shift, or mass effect. Mild ventricular prominence related to global parenchymal volume loss without hydrocephalus. No extra-axial fluid collection. Major dural sinuses are grossly patent. Pituitary gland within normal limits. Vascular: Major intracranial vascular flow voids maintained. Skull and upper cervical spine: Mild degenerative ligamentous thickening noted at the craniocervical junction. Visualized upper cervical spine otherwise unremarkable. Bone marrow signal intensity within normal limits. No scalp soft tissue abnormality. Sinuses/Orbits: Globes and oval soft tissues within normal limits. Scattered mucosal thickening throughout the paranasal sinuses, greatest within the ethmoid air cells. No mastoid effusion. Inner ear structures normal. Other: 2.8 cm well-circumscribed T2 hyperintense mass noted within the left parotid gland, incompletely visualized. MRA HEAD FINDINGS ANTERIOR CIRCULATION: Distal cervical segments of the internal carotid arteries are patent with antegrade flow. Petrous, cavernous, supraclinoid segments widely patent without flow-limiting stenosis. Dominant right A1 segment widely patent. Left A1 segment hypoplastic but patent. This likely accounts for the slightly diminutive left ICA is compared to the right. Anterior communicating artery normal. Anterior cerebral arteries widely patent to their distal aspects. M1 segments widely patent without stenosis or occlusion. Distal MCA branches well  opacified and symmetric. POSTERIOR CIRCULATION: Left vertebral artery dominant and widely patent to the vertebrobasilar junction. Right vertebral artery hypoplastic but grossly patent as well. Posterior inferior cerebral arteries patent proximally. Basilar artery widely patent. Superior cerebellar and posterior cerebral arteries widely patent bilaterally. No aneurysm or vascular malformation. IMPRESSION: MRI HEAD IMPRESSION: 1. 19 mm acute nonhemorrhagic lacunar infarct involving the right basal ganglia. No associated mass effect. 2. Remote left occipital lobe infarct. 3. Generalized age-related cerebral atrophy. 4. 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 Nonemergent ENT consultation suggested. MRA HEAD IMPRESSION: Negative intracranial MRA. No large vessel occlusion. No high-grade or correctable stenosis. Electronically Signed   By: Marland Kitchen  Jeannine Boga M.D.   On: 12/19/2016 17:59   Mr Jodene Nam Headm  Result Date: 12/19/2016 CLINICAL DATA:  Initial evaluation for acute left-sided weakness, facial droop. EXAM: MRI HEAD WITHOUT CONTRAST MRA HEAD WITHOUT CONTRAST TECHNIQUE: Multiplanar, multiecho pulse sequences of the brain and surrounding structures were obtained without intravenous contrast. Angiographic images of the head were obtained using MRA technique without contrast. COMPARISON:  Prior CT from earlier same day. FINDINGS: MRI HEAD FINDINGS Brain: Diffuse prominence of the CSF containing spaces is compatible with generalized age-related cerebral atrophy. No significant cerebral white matter disease for patient age. Focal encephalomalacia with gliosis within the left occipital lobe compatible with remote infarct. Small curvilinear focus of restricted diffusion extends from the posterior aspect of the right lentiform nucleus towards the right corona radiata, compatible with a small acute lacunar infarct (series 4, image 28). Infarct measures 19 mm in length. No associated hemorrhage or mass effect.  No other evidence for acute or subacute ischemia. No mass lesion, midline shift, or mass effect. Mild ventricular prominence related to global parenchymal volume loss without hydrocephalus. No extra-axial fluid collection. Major dural sinuses are grossly patent. Pituitary gland within normal limits. Vascular: Major intracranial vascular flow voids maintained. Skull and upper cervical spine: Mild degenerative ligamentous thickening noted at the craniocervical junction. Visualized upper cervical spine otherwise unremarkable. Bone marrow signal intensity within normal limits. No scalp soft tissue abnormality. Sinuses/Orbits: Globes and oval soft tissues within normal limits. Scattered mucosal thickening throughout the paranasal sinuses, greatest within the ethmoid air cells. No mastoid effusion. Inner ear structures normal. Other: 2.8 cm well-circumscribed T2 hyperintense mass noted within the left parotid gland, incompletely visualized. MRA HEAD FINDINGS ANTERIOR CIRCULATION: Distal cervical segments of the internal carotid arteries are patent with antegrade flow. Petrous, cavernous, supraclinoid segments widely patent without flow-limiting stenosis. Dominant right A1 segment widely patent. Left A1 segment hypoplastic but patent. This likely accounts for the slightly diminutive left ICA is compared to the right. Anterior communicating artery normal. Anterior cerebral arteries widely patent to their distal aspects. M1 segments widely patent without stenosis or occlusion. Distal MCA branches well opacified and symmetric. POSTERIOR CIRCULATION: Left vertebral artery dominant and widely patent to the vertebrobasilar junction. Right vertebral artery hypoplastic but grossly patent as well. Posterior inferior cerebral arteries patent proximally. Basilar artery widely patent. Superior cerebellar and posterior cerebral arteries widely patent bilaterally. No aneurysm or vascular malformation. IMPRESSION: MRI HEAD IMPRESSION: 1.  19 mm acute nonhemorrhagic lacunar infarct involving the right basal ganglia. No associated mass effect. 2. Remote left occipital lobe infarct. 3. Generalized age-related cerebral atrophy. 4. 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 Nonemergent ENT consultation suggested. MRA HEAD IMPRESSION: Negative intracranial MRA. No large vessel occlusion. No high-grade or correctable stenosis. Electronically Signed   By: Jeannine Boga M.D.   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 Does the need for close, 24 hr/day medical supervision in concert with the patient's rehab needs make it unreasonable for this patient to be served in a less intensive setting? Yes 2. Co-Morbidities requiring supervision/potential complications: bilateral OA of knees, GERD, hx of gout.  3. Due to bladder management, bowel management, safety, skin/wound care, disease management, medication administration, pain management and patient education, does the patient require 24 hr/day rehab nursing? Yes 4. Does the patient require coordinated care of a physician, rehab nurse, PT (1-2 hrs/day, 5 days/week), OT (1-2 hrs/day, 5 days/week) and SLP (1-2 hrs/day, 5  days/week) to address physical and functional deficits in the context of the above medical diagnosis(es)? Yes Addressing deficits in the following areas: balance, endurance, locomotion, strength, transferring, bowel/bladder control, bathing, dressing, feeding, grooming, toileting, cognition, speech, language, swallowing and psychosocial support 5. Can the patient actively participate in an intensive therapy program of at least 3 hrs of therapy per day at least 5 days per week? Yes 6. The potential for patient to make measurable gains while on inpatient rehab is good 7. Anticipated functional outcomes upon discharge from inpatient rehab are supervision and min assist   with PT, supervision, min assist and mod assist with OT, modified independent and supervision with SLP. 8. Estimated rehab length of stay to reach the above functional goals is: 18-23 days 9. Does the patient have adequate social supports and living environment to accommodate these discharge functional goals? Yes 10. Anticipated D/C setting: Home 11. Anticipated post D/C treatments: HH therapy and Outpatient therapy 12. Overall Rehab/Functional Prognosis: excellent  RECOMMENDATIONS: This patient's condition is appropriate for continued rehabilitative care in the following setting: CIR Patient has agreed to participate in recommended program. Yes Note that insurance prior authorization may be required for reimbursement for recommended care.  Comment: Pt was independent and living alone prior to admission. Family appears supportive.  Rehab Admissions Coordinator to follow up.  Thanks,  Meredith Staggers, MD, Mellody Drown    Cathlyn Parsons., PA-C 12/20/2016

## 2016-12-21 DIAGNOSIS — R531 Weakness: Secondary | ICD-10-CM

## 2016-12-21 LAB — CBC
HEMATOCRIT: 40.9 % (ref 36.0–46.0)
HEMOGLOBIN: 12.9 g/dL (ref 12.0–15.0)
MCH: 28.9 pg (ref 26.0–34.0)
MCHC: 31.5 g/dL (ref 30.0–36.0)
MCV: 91.5 fL (ref 78.0–100.0)
Platelets: 108 10*3/uL — ABNORMAL LOW (ref 150–400)
RBC: 4.47 MIL/uL (ref 3.87–5.11)
RDW: 13.6 % (ref 11.5–15.5)
WBC: 7.1 10*3/uL (ref 4.0–10.5)

## 2016-12-21 LAB — BASIC METABOLIC PANEL
ANION GAP: 6 (ref 5–15)
BUN: 30 mg/dL — ABNORMAL HIGH (ref 6–20)
CALCIUM: 8.7 mg/dL — AB (ref 8.9–10.3)
CHLORIDE: 112 mmol/L — AB (ref 101–111)
CO2: 25 mmol/L (ref 22–32)
Creatinine, Ser: 1.12 mg/dL — ABNORMAL HIGH (ref 0.44–1.00)
GFR calc non Af Amer: 46 mL/min — ABNORMAL LOW (ref >60)
GFR, EST AFRICAN AMERICAN: 53 mL/min — AB (ref >60)
Glucose, Bld: 79 mg/dL (ref 65–99)
Potassium: 3.9 mmol/L (ref 3.5–5.1)
SODIUM: 143 mmol/L (ref 135–145)

## 2016-12-21 LAB — T3, FREE: T3, Free: 2 pg/mL (ref 2.0–4.4)

## 2016-12-21 LAB — HEMOGLOBIN A1C
Hgb A1c MFr Bld: 5.6 % (ref 4.8–5.6)
Mean Plasma Glucose: 114 mg/dL

## 2016-12-21 MED ORDER — ASPIRIN EC 325 MG PO TBEC: 325.0000 mg | DELAYED_RELEASE_TABLET | Freq: Every day | ORAL | 0 refills | Status: AC

## 2016-12-21 NOTE — Discharge Summary (Signed)
Physician Discharge Summary  Dana Harrington UTM:[id number] DOB: 1941/01/30  PCP: Jani Gravel, MD  Admit date: 12/19/2016 Discharge date: 12/21/2016  Recommendations for Outpatient Follow-up:  1. Dr. Jani Gravel, PCP in 3 days. 2. Recommend outpatient ENT consultation for evaluation of carotid mass seen on MRI.  3. Dr. Asencion Partridge Dohmeier, Neurology in 4 weeks.  Home Health: PT, OT and SLP Equipment/Devices: Lightweight manual chair with question & Hoyer lift  Discharge Condition: Improved and stable  CODE STATUS: Full  Diet recommendation: Dysphagia 2 diet and thin liquids.  Discharge Diagnoses:  Active Problems:   Ischemic stroke [facility])   Essential hypertension   Hypothyroidism   Musculoskeletal pain   GERD (gastroesophageal reflux disease)   Seizure ([employer])   Gout   Brief Summary: 76 year old female, lives alone, ambulates with the help of a cane/walker, PMH of aortic stenosis, GERD, gout, HTN, hypothyroid, seizure disorder, arthritis of her knees and not a surgical candidate, presented to ED with facial droop, slurred speech and left-sided weakness which started sometime on the night prior to admission. Stroke confirmed. Neurology consulting.   Assessment & Plan:   Acute stroke: Right basal ganglia infarct secondary to small vessel disease. Resultant dysarthria, facial asymmetry and left hemiparesis (LUE weaker than LLE). All improving. CT head without acute abnormality MRI brain: Right basal ganglia lacunar infarct. 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 Atrophy. 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 MRA brain: Unremarkable. Carotid Doppler: Bilateral 1-39 percent ICA stenosis. Right vertebral artery could not be insonated. No color-flow or Doppler signals noted. Left vertebral artery flow is antegrade. 2-D echo: LVEF 65-70 percent and grade 2 diastolic dysfunction. LDL 57 Hemoglobin A1c: 5.6. Not on antithrombotic prior to admission, now on aspirin 325 MG daily for secondary  stroke prophylaxis. Therapies recommend CIR. Rehabilitation M.D. consultation appreciated and felt appropriate for rehabilitation. Outpatient follow-up with neurology/Dr. Dohmeir Discussed with stroke M.D. Faythe Ghee to resume beta blockers and cleared for discharge home.  Family indicates that they do not want patient to go to CIR or SNF and wish to take patient home. According to them, patient had baseline poor quality of physical activity and they do not expect significant recovery by going to CIR or SNF and also patient is keen on returning home. I advised him that the degree of improvement in her disabilities will not be known until she actually goes through the therapies. Despite all the discussion & being aware of risks of slow or lack of recovery by returning home with home health therapies, they felt comfortable and wish to take patient home with home health therapies. This was discussed with patient's daughter and niece. Updated stroke M.D.  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 This will be resumed at discharge.  Hyperlipidemia LDL 57 and at goal. Continue Pravachol at home dose.  Seizure disorder Last seizure remote, 1987. On Depakote and Zonegran. Follows with neurology.  Hypothyroid Continue Synthroid. TSH normal.  GERD Continue PPI  Bilateral knee arthritis - As per daughter, has been told that she is not a candidate for surgery.  Acute on stage III chronic kidney disease Presented with creatinine of 2.02. Baseline creatinine probably in the 1.4 range. Creatinine has improved to 1.25.  Anemia ? Dilutional. Repeat hemoglobin normal.  Thrombocytopenia, chronic Stable. Outpatient follow-up   Consultants:  Neurology Rehabilitation M.D.   Procedures:  Carotid Doppler: Results as above 2-D  echo: Study  Conclusions  - Left ventricle: The cavity size was normal. Systolic function was vigorous. The estimated ejection fraction was in the range of 65% to 70%. Wall motion was normal; there were no regional wall motion abnormalities. Features are consistent with a pseudonormal left ventricular filling pattern, with concomitant abnormal relaxation and increased filling pressure (grade 2 diastolic dysfunction). Doppler parameters are consistent with elevated ventricular end-diastolic filling pressure. - Aortic valve: Transvalvular velocity was minimally increased. There was very mild stenosis. There was moderate regurgitation. Mean gradient (S): 9 mm Hg. Peak gradient (S): 17 mm Hg. Valve area (VTI): 1.88 cm^2. Valve area (Vmax): 1.62 cm^2. Valve area (Vmean): 1.89 cm^2. - Aortic root: The aortic root was normal in size. - Mitral valve: Calcified annulus. Mildly thickened leaflets . - Right ventricle: The cavity size was normal. Wall thickness was normal. Systolic function was normal. - Right atrium: The atrium was normal in size. - Tricuspid valve: There was moderate regurgitation. - Pulmonic valve: There was no regurgitation. - Pulmonary arteries: Systolic pressure was moderately increased. PA peak pressure: 40 mm Hg (S). - Inferior vena cava: The vessel was normal in size. - Pericardium, extracardiac: There was no pericardial effusion.  Discharge Instructions  Discharge Instructions    Call MD for:    Complete by:  As directed    Recurrent or worsening strokelike symptoms.   Diet - low sodium heart healthy    Complete by:  As directed    Increase activity slowly    Complete by:  As directed        Medication List    TAKE these medications   acetaminophen 325 MG tablet Commonly known as:  TYLENOL Take 325 mg by mouth every 6 (six) hours as needed for pain.   aspirin EC 325 MG tablet Take 1 tablet (325 mg total) by mouth daily. Start taking on:   12/22/2016   carvedilol 12.5 MG tablet Commonly known as:  COREG Take 12.5 mg by mouth 2 (two) times daily with a meal.   divalproex 500 MG DR tablet Commonly known as:  DEPAKOTE Take 500 mg by mouth 2 (two) times daily.   febuxostat 40 MG tablet Commonly known as:  ULORIC Take 40 mg by mouth daily.   HYDROcodone-acetaminophen 5-325 MG tablet Commonly known as:  NORCO/VICODIN Take 1 tablet by mouth 2 (two) times daily as needed for pain.   ibuprofen 200 MG tablet Commonly known as:  ADVIL,MOTRIN Take 200 mg by mouth every 6 (six) hours as needed for moderate pain.   levothyroxine 100 MCG tablet Commonly known as:  SYNTHROID, LEVOTHROID Take 100 mcg by mouth daily before breakfast.   loratadine-pseudoephedrine 10-240 MG 24 hr tablet Commonly known as:  CLARITIN-D 24-hour Take 1 tablet by mouth daily.   methocarbamol 500 MG tablet Commonly known as:  ROBAXIN Take 500 mg by mouth every 6 (six) hours as needed for muscle spasms.   omeprazole 40 MG capsule Commonly known as:  PRILOSEC Take 1 capsule by mouth 2 (two) times daily.   pravastatin 40 MG tablet Commonly known as:  PRAVACHOL Take 40 mg by mouth daily.   sucralfate 1 g tablet Commonly known as:  CARAFATE Take 1 tablet by mouth 2 (two) times daily.   Vitamin D (Ergocalciferol) 50000 units Caps capsule Commonly known as:  DRISDOL Take 50,000 Units by mouth once a week.   zonisamide 100 MG capsule Commonly known as:  ZONEGRAN Take 100 mg by mouth 2 (two) times daily.  Follow-up Information    Jani Gravel, MD. Schedule an appointment as soon as possible for a visit in 3 day(s).   Specialty:  Internal Medicine Contact information: [address] [place] [address] [phone]        [facility], MD. Schedule an appointment as soon as possible for a visit in 4 week(s).   Specialty:  Neurology Contact information: [address] [place]  [address] [phone]          Allergies  Allergen Reactions  . Gabapentin Other (See Comments)    "Bouncing"   . Penicillins Rash      Procedures/Studies: Dg Chest 2 View  Result Date: 12/19/2016 CLINICAL DATA:  Cough.  Stroke symptoms with left-sided weakness EXAM: CHEST  2 VIEW COMPARISON:  07/18/2015 FINDINGS: Cardiac enlargement without heart failure. Negative for pneumonia or effusion. No mass lesion. Improved aeration compared with the prior study. Atherosclerotic aorta. IMPRESSION: Cardiac enlargement.  No acute abnormality. Electronically Signed   By: Franchot Gallo M.D.   On: 12/19/2016 13:13   Ct Head Wo Contrast  Result Date: 12/19/2016 CLINICAL DATA:  Left-sided weakness EXAM: CT HEAD WITHOUT CONTRAST TECHNIQUE: Contiguous axial images were obtained from the base of the skull through the vertex without intravenous contrast. COMPARISON:  07/05/2004 FINDINGS: Brain: No evidence of acute infarction, hemorrhage, hydrocephalus, extra-axial collection or mass lesion/mass effect. Vascular: No hyperdense vessel or unexpected calcification. Skull: Normal. Negative for fracture or focal lesion. Sinuses/Orbits: No acute finding. Other: None. IMPRESSION: No acute intracranial abnormality noted. Electronically Signed   By: Inez Catalina M.D.   On: 12/19/2016 11:40   Mr Brain Wo Contrast  Result Date: 12/19/2016 CLINICAL DATA:  Initial evaluation for acute left-sided weakness, facial droop. EXAM: MRI HEAD WITHOUT CONTRAST MRA HEAD WITHOUT CONTRAST TECHNIQUE: Multiplanar, multiecho pulse sequences of the brain and surrounding structures were obtained without intravenous contrast. Angiographic images of the head were obtained using MRA technique without contrast. COMPARISON:  Prior CT from earlier same day. FINDINGS: MRI HEAD FINDINGS Brain: Diffuse prominence of the CSF containing spaces is compatible with generalized age-related cerebral atrophy. No significant cerebral white matter disease  for patient age. Focal encephalomalacia with gliosis within the left occipital lobe compatible with remote infarct. Small curvilinear focus of restricted diffusion extends from the posterior aspect of the right lentiform nucleus towards the right corona radiata, compatible with a small acute lacunar infarct (series 4, image 28). Infarct measures 19 mm in length. No associated hemorrhage or mass effect. No other evidence for acute or subacute ischemia. No mass lesion, midline shift, or mass effect. Mild ventricular prominence related to global parenchymal volume loss without hydrocephalus. No extra-axial fluid collection. Major dural sinuses are grossly patent. Pituitary gland within normal limits. Vascular: Major intracranial vascular flow voids maintained. Skull and upper cervical spine: Mild degenerative ligamentous thickening noted at the craniocervical junction. Visualized upper cervical spine otherwise unremarkable. Bone marrow signal intensity within normal limits. No scalp soft tissue abnormality. Sinuses/Orbits: Globes and oval soft tissues within normal limits. Scattered mucosal thickening throughout the paranasal sinuses, greatest within the ethmoid air cells. No mastoid effusion. Inner ear structures normal. Other: 2.8 cm well-circumscribed T2 hyperintense mass noted within the left parotid gland, incompletely visualized. MRA HEAD FINDINGS ANTERIOR CIRCULATION: Distal cervical segments of the internal carotid arteries are patent with antegrade flow. Petrous, cavernous, supraclinoid segments widely patent without flow-limiting stenosis. Dominant right A1 segment widely patent. Left A1 segment hypoplastic but patent. This likely accounts for the slightly  diminutive left ICA is compared to the right. Anterior communicating artery normal. Anterior cerebral arteries widely patent to their distal aspects. M1 segments widely patent without stenosis or occlusion. Distal MCA branches well opacified and symmetric.  POSTERIOR CIRCULATION: Left vertebral artery dominant and widely patent to the vertebrobasilar junction. Right vertebral artery hypoplastic but grossly patent as well. Posterior inferior cerebral arteries patent proximally. Basilar artery widely patent. Superior cerebellar and posterior cerebral arteries widely patent bilaterally. No aneurysm or vascular malformation. IMPRESSION: MRI HEAD IMPRESSION: 1. 19 mm acute nonhemorrhagic lacunar infarct involving the right basal ganglia. No associated mass effect. 2. Remote left occipital lobe infarct. 3. Generalized age-related cerebral atrophy. 4. 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 Nonemergent ENT consultation suggested. MRA HEAD IMPRESSION: Negative intracranial MRA. No large vessel occlusion. No high-grade or correctable stenosis. Electronically Signed   By: Jeannine Boga M.D.   On: 12/19/2016 17:59   Mr Jodene Nam Headm  Result Date: 12/19/2016 CLINICAL DATA:  Initial evaluation for acute left-sided weakness, facial droop. EXAM: MRI HEAD WITHOUT CONTRAST MRA HEAD WITHOUT CONTRAST TECHNIQUE: Multiplanar, multiecho pulse sequences of the brain and surrounding structures were obtained without intravenous contrast. Angiographic images of the head were obtained using MRA technique without contrast. COMPARISON:  Prior CT from earlier same day. FINDINGS: MRI HEAD FINDINGS Brain: Diffuse prominence of the CSF containing spaces is compatible with generalized age-related cerebral atrophy. No significant cerebral white matter disease for patient age. Focal encephalomalacia with gliosis within the left occipital lobe compatible with remote infarct. Small curvilinear focus of restricted diffusion extends from the posterior aspect of the right lentiform nucleus towards the right corona radiata, compatible with a small acute lacunar infarct (series 4, image 28). Infarct measures 19 mm in length. No associated hemorrhage or mass effect. No other evidence for  acute or subacute ischemia. No mass lesion, midline shift, or mass effect. Mild ventricular prominence related to global parenchymal volume loss without hydrocephalus. No extra-axial fluid collection. Major dural sinuses are grossly patent. Pituitary gland within normal limits. Vascular: Major intracranial vascular flow voids maintained. Skull and upper cervical spine: Mild degenerative ligamentous thickening noted at the craniocervical junction. Visualized upper cervical spine otherwise unremarkable. Bone marrow signal intensity within normal limits. No scalp soft tissue abnormality. Sinuses/Orbits: Globes and oval soft tissues within normal limits. Scattered mucosal thickening throughout the paranasal sinuses, greatest within the ethmoid air cells. No mastoid effusion. Inner ear structures normal. Other: 2.8 cm well-circumscribed T2 hyperintense mass noted within the left parotid gland, incompletely visualized. MRA HEAD FINDINGS ANTERIOR CIRCULATION: Distal cervical segments of the internal carotid arteries are patent with antegrade flow. Petrous, cavernous, supraclinoid segments widely patent without flow-limiting stenosis. Dominant right A1 segment widely patent. Left A1 segment hypoplastic but patent. This likely accounts for the slightly diminutive left ICA is compared to the right. Anterior communicating artery normal. Anterior cerebral arteries widely patent to their distal aspects. M1 segments widely patent without stenosis or occlusion. Distal MCA branches well opacified and symmetric. POSTERIOR CIRCULATION: Left vertebral artery dominant and widely patent to the vertebrobasilar junction. Right vertebral artery hypoplastic but grossly patent as well. Posterior inferior cerebral arteries patent proximally. Basilar artery widely patent. Superior cerebellar and posterior cerebral arteries widely patent bilaterally. No aneurysm or vascular malformation. IMPRESSION: MRI HEAD IMPRESSION: 1. 19 mm acute  nonhemorrhagic lacunar infarct involving the right basal ganglia. No associated mass effect. 2. Remote left occipital lobe infarct. 3. Generalized age-related cerebral atrophy. 4. 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  Nonemergent ENT consultation suggested. MRA HEAD IMPRESSION: Negative intracranial MRA. No large vessel occlusion. No high-grade or correctable stenosis. Electronically Signed   By: Jeannine Boga M.D.   On: 12/19/2016 17:59      Subjective:   Discharge Exam:  Vitals:   12/21/16 0049 12/21/16 0641 12/21/16 1101 12/21/16 1452  BP: (!) 164/47 (!) 170/44 (!) 146/43 (!) 153/50  Pulse: 60 (!) 55 64 61  Resp: 16 16 16 16   Temp: 97.4 F (36.3 C) 97.8 F (36.6 C) 97.5 F (36.4 C) 97.7 F (36.5 C)  TempSrc: Oral Oral Oral Oral  SpO2: 98% 99% 98% 94%  Weight:      Height:        General exam: Pleasant elderly female lying comfortably propped up in bed. Respiratory system: Clear to auscultation. Respiratory effort normal. Cardiovascular system: S1 & S2 heard, RRR. No JVD, murmurs, rubs, gallops or clicks. No pedal edema. Telemetry: SB in the 50s-SR Gastrointestinal system: Abdomen is nondistended, soft and nontender. No organomegaly or masses felt. Normal bowel sounds heard. Central nervous system: Alert and oriented 3. Facial asymmetry +. Dysarthria +. No other cranial nerve deficits. Extremities: 5 x 5 power in right extremities. 2 x 5 power in left upper extremity and 3 x 5 power in left lower extremity Skin: No rashes, lesions or ulcers Psychiatry: Judgement and insight appear normal. Mood & affect appropriate.     The results of significant diagnostics from this hospitalization (including imaging, microbiology, ancillary and laboratory) are listed below for reference.     Microbiology: No results found for this or any previous visit (from the past 240 hour(s)).   Labs: CBC:  Recent Labs Lab 12/19/16 1105 12/19/16 1125 12/20/16 0421  12/21/16 0714  WBC 6.9  --  5.8 7.1  NEUTROABS 3.0  --   --   --   HGB 12.5 13.6 11.9* 12.9  HCT 39.2 40.0 37.1 40.9  MCV 92.0  --  92.3 91.5  PLT 90*  --  89* 892*   Basic Metabolic Panel:  Recent Labs Lab 12/19/16 1105 12/19/16 1125 12/20/16 0421 12/21/16 0714  NA 141 142 142 143  K 4.5 4.4 4.0 3.9  CL 109 110 108 112*  CO2 24  --  24 25  GLUCOSE 79 81 73 79  BUN 43* 45* 35* 30*  CREATININE 2.02* 2.10* 1.25* 1.12*  CALCIUM 8.4*  --  8.3* 8.7*   Liver Function Tests:  Recent Labs Lab 12/19/16 1105  AST 30  ALT 13*  ALKPHOS 63  BILITOT 0.9  PROT 6.7  ALBUMIN 2.7*   CBG:  Recent Labs Lab 12/20/16 1139 12/20/16 1644  GLUCAP 99 102*   Hgb A1c  Recent Labs  12/20/16 0421  HGBA1C 5.6   Lipid Profile  Recent Labs  12/20/16 0421  CHOL 110  HDL 31*  LDLCALC 57  TRIG 108  CHOLHDL 3.5   Thyroid function studies  Recent Labs  12/19/16 1744  TSH 1.245  T3FREE 2.0   Urinalysis    Component Value Date/Time   COLORURINE YELLOW 12/19/2016 1229   APPEARANCEUR CLEAR 12/19/2016 1229   LABSPEC 1.019 12/19/2016 1229   PHURINE 5.0 12/19/2016 1229   GLUCOSEU NEGATIVE 12/19/2016 1229   HGBUR SMALL (A) 12/19/2016 1229   BILIRUBINUR NEGATIVE 12/19/2016 1229   KETONESUR NEGATIVE 12/19/2016 1229   PROTEINUR NEGATIVE 12/19/2016 1229   UROBILINOGEN 0.2 07/18/2015 0108   NITRITE NEGATIVE 12/19/2016 1229   LEUKOCYTESUR NEGATIVE 12/19/2016 1229    Discussed  in detail with patient's daughter and niece. Updated care and answered questions.  Time coordinating discharge: Over 30 minutes  SIGNED:  Vernell Leep, MD, FACP, [place]. [facility] Pager [phone]  If 7PM-7AM, please contact night-coverage [email or web address] Password [facility] 12/21/2016, 3:47 PM

## 2016-12-21 NOTE — Progress Notes (Signed)
CSW received consult regarding PT recommendation of SNF at discharge.  Patient and family are refusing SNF. Patient's daughter stated she would bring patient home with her at discharge and would like home health services. RNCM alerted.   CSW signing off.   Dana Harrington LCSWA [phone]

## 2016-12-21 NOTE — Care Management Note (Addendum)
Case Management Note  Patient Details  Name: Dana Harrington MRN: [id number] Date of Birth: [birth year]  Subjective/Objective:                 Requested to speak with family. Spoke to daughter and granddaughter in room. They requested information on HH and CIR. They feel patient would not tolerate intensive therapy of CIR and are not interested in SNF. They were counseled on amount of assistance HH would provide. Spoke with nurse at the bedside. Family informed patient max assist. Family states that they would plan on patient to DC to daughter's house at [place] in [place]. They state they have several very strong family member that would be able to help transfer patient at home and into car at DC. They declined PTAR at this time although encouraged by CM and RN. They state they have BSC, lift chair, transfer chair cane and walker at home. They would like wheelchair and hoyer to be delivered to house. They state they will be getting adjustable bed and declined offer for hospital bed. They looked into [facility] companies and would like to use [facility]. Referral made to Danny Lawless and patient accepted for [facility] RN HHA PT OT if [place] to home. CM spoke with Dr Algis Liming about family request to take patient home. RN stated he called family after CM left room.    Action/Plan:  Patient would need [place] orders for RN PT OT HHA if DC to home. Referral placed to [facility]. Orders for DME referral needs placed. Hoyer and WC could be delivered to home tomorrow per [facility].   Expected Discharge Date:                  Expected Discharge Plan:  [place]  In-House Referral:     Discharge planning Services  CM Consult  Post Acute Care Choice:  Durable Medical Equipment, Home Health Choice offered to:  Adult Children  DME Arranged:  Programmer, multimedia Product manager ) DME Agency:  [place]:    [place] Agency:   Nanine Means)  Status of Service:  In process,  will continue to follow  If discussed at Long Length of Stay Meetings, dates discussed:    Additional Comments:  Carles Collet, RN 12/21/2016, 3:37 PM

## 2016-12-21 NOTE — Progress Notes (Signed)
PROGRESS NOTE   Dana Harrington  WIO:[id number]    DOB: [birth year]    DOA: 12/19/2016  PCP: Jani Gravel, MD   I have briefly reviewed patients previous medical records in [facility].  Brief Narrative:  76 year old female, lives alone, ambulates with the help of a cane/walker, PMH of aortic stenosis, GERD, gout, HTN, hypothyroid, seizure disorder, arthritis of her knees and not a surgical candidate, presented to ED with facial droop, slurred speech and left-sided weakness which started sometime on the night prior to admission. Stroke confirmed. Neurology consulting.   Assessment & Plan:   Active Problems:   Ischemic stroke [facility])   Essential hypertension   Hypothyroidism   Musculoskeletal pain   GERD (gastroesophageal reflux disease)   Seizure (HCC)   Gout   Acute stroke: Right basal ganglia infarct secondary to small vessel disease. Resultant dysarthria, facial asymmetry and left hemiparesis (LUE weaker than LLE). All improving. CT head without acute abnormality MRI brain: Right basal ganglia lacunar infarct. 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 Atrophy. 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 MRA brain: Unremarkable. Carotid Doppler: Bilateral 1-39 percent ICA stenosis. Right vertebral artery could not be insonated. No color-flow or Doppler signals noted. Left vertebral artery flow is antegrade. 2-D echo: LVEF 65-70 percent and grade 2 diastolic dysfunction. LDL 57 Hemoglobin A1c: 5.6. Not on antithrombotic prior to admission, now on aspirin 325 MG daily for secondary stroke prophylaxis. Therapies recommend CIR. Rehabilitation M.D. consultation appreciated and felt appropriate for rehabilitation. Outpatient follow-up with neurology/Dr. Dohmeir Discussed with stroke M.D.  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 Holding  carvedilol.  Hyperlipidemia LDL 57 and at goal. Continue Pravachol at home dose.  Seizure disorder Last seizure remote, 1987. On Depakote and Zonegran. Follows with neurology.  Hypothyroid Continue Synthroid. TSH normal.  GERD Continue PPI  Bilateral knee arthritis - As per daughter, has been told that she is not a candidate for surgery.  Acute on stage III chronic kidney disease Presented with creatinine of 2.02. Baseline creatinine probably in the 1.4 range. Creatinine has improved to 1.25.  Anemia ? Dilutional. Repeat hemoglobin normal.  Thrombocytopenia, chronic Stable. Outpatient follow-up   DVT prophylaxis: Heparin Code Status: Full Family Communication: Discussed in detail with granddaughter at bedside. Disposition: DC to CIR when medically improved and stroke workup completed, CIR versus SNF >these options were discussed in detail with patient's daughter on 3/23 including home health services. She did not want patient to be at SNF indefinitely and I advised her that this was for short-term rehabilitation and she seemed agreeable.   Consultants:  Neurology Rehabilitation M.D.   Procedures:  Carotid Doppler: Results as above 2-D echo: Study Conclusions  - Left ventricle: The cavity size was normal. Systolic function was   vigorous. The estimated ejection fraction was in the range of 65%   to 70%. Wall motion was normal; there were no regional wall   motion abnormalities. Features are consistent with a pseudonormal   left ventricular filling pattern, with concomitant abnormal   relaxation and increased filling pressure (grade 2 diastolic   dysfunction). Doppler parameters are consistent with elevated   ventricular end-diastolic filling pressure. - Aortic valve: Transvalvular velocity was minimally increased.   There was very mild stenosis. There was moderate regurgitation.   Mean gradient (S): 9 mm Hg. Peak gradient (S): 17 mm Hg. Valve   area (VTI): 1.88  cm^2. Valve area (Vmax): 1.62 cm^2. Valve area   (Vmean): 1.89 cm^2. - Aortic root: The aortic root was normal in size. - Mitral valve: Calcified annulus. Mildly thickened leaflets . - Right ventricle: The cavity size was normal. Wall thickness was   normal. Systolic function was normal. - Right atrium: The atrium was normal in size. - Tricuspid valve: There was moderate regurgitation. - Pulmonic valve: There was no regurgitation. - Pulmonary arteries: Systolic pressure was moderately increased.   PA peak pressure: 40 mm Hg (S). - Inferior vena cava: The vessel was normal in size. - Pericardium, extracardiac: There was no pericardial effusion.  Antimicrobials:  None    Subjective: States that she feels better. Improved slurred speech and left-sided weakness. Granddaughter at bedside agrees.  ROS: No chest pain or dyspnea reported.  Objective:  Vitals:   12/21/16 0049 12/21/16 0641 12/21/16 1101 12/21/16 1452  BP: (!) 164/47 (!) 170/44 (!) 146/43 (!) 153/50  Pulse: 60 (!) 55 64 61  Resp: 16 16 16 16   Temp: 97.4 F (36.3 C) 97.8 F (36.6 C) 97.5 F (36.4 C) 97.7 F (36.5 C)  TempSrc: Oral Oral Oral Oral  SpO2: 98% 99% 98% 94%  Weight:      Height:        Examination:  General exam: Pleasant elderly female lying comfortably propped up in bed. Respiratory system: Clear to auscultation. Respiratory effort normal. Cardiovascular system: S1 & S2 heard, RRR. No JVD, murmurs, rubs, gallops or clicks. No pedal edema. Telemetry: SB in the 50s-SR Gastrointestinal system: Abdomen is nondistended, soft and nontender. No organomegaly or masses felt. Normal bowel sounds heard. Central nervous system: Alert and oriented 3. Facial asymmetry +. Dysarthria +. No other cranial nerve deficits. Extremities: 5 x 5 power in right extremities. 2 x 5 power in left upper extremity and 3 x 5 power in left lower extremity Skin: No rashes, lesions or ulcers Psychiatry: Judgement and insight  appear normal. Mood & affect appropriate.     Data Reviewed: I have personally reviewed following labs and imaging studies  CBC:  Recent Labs Lab 12/19/16 1105 12/19/16 1125 12/20/16 0421 12/21/16 0714  WBC 6.9  --  5.8 7.1  NEUTROABS 3.0  --   --   --   HGB 12.5 13.6 11.9* 12.9  HCT 39.2 40.0 37.1 40.9  MCV 92.0  --  92.3 91.5  PLT 90*  --  89* 297*   Basic Metabolic Panel:  Recent Labs Lab 12/19/16 1105 12/19/16 1125 12/20/16 0421 12/21/16 0714  NA 141 142 142 143  K 4.5 4.4 4.0 3.9  CL 109 110 108 112*  CO2 24  --  24 25  GLUCOSE 79 81 73 79  BUN 43* 45* 35* 30*  CREATININE 2.02* 2.10* 1.25* 1.12*  CALCIUM 8.4*  --  8.3* 8.7*   Liver Function Tests:  Recent Labs Lab 12/19/16 1105  AST 30  ALT 13*  ALKPHOS 63  BILITOT 0.9  PROT 6.7  ALBUMIN 2.7*   Coagulation Profile:  Recent Labs Lab 12/19/16 1105  INR 1.10   Cardiac Enzymes: No results for input(s): CKTOTAL, CKMB, CKMBINDEX, TROPONINI in the last 168 hours. HbA1C:  Recent Labs  12/20/16 0421  HGBA1C 5.6   CBG:  Recent Labs Lab 12/20/16 1139 12/20/16 1644  GLUCAP 99 102*    No results found for this or any previous visit (from the past 240 hour(s)).       Radiology Studies: Mr Brain Wo Contrast  Result Date: 12/19/2016 CLINICAL DATA:  Initial evaluation for acute left-sided weakness, facial droop. EXAM: MRI HEAD WITHOUT CONTRAST MRA HEAD WITHOUT CONTRAST TECHNIQUE: Multiplanar, multiecho pulse sequences of the brain and surrounding structures were obtained without intravenous contrast. Angiographic images of the head were obtained using MRA technique without contrast. COMPARISON:  Prior CT from earlier same day. FINDINGS: MRI HEAD FINDINGS Brain: Diffuse prominence of the CSF containing spaces is compatible with generalized age-related cerebral atrophy. No significant cerebral white matter disease for patient age. Focal encephalomalacia with gliosis within the left occipital lobe  compatible with remote infarct. Small curvilinear focus of restricted diffusion extends from the posterior aspect of the right lentiform nucleus towards the right corona radiata, compatible with a small acute lacunar infarct (series 4, image 28). Infarct measures 19 mm in length. No associated hemorrhage or mass effect. No other evidence for acute or subacute ischemia. No mass lesion, midline shift, or mass effect. Mild ventricular prominence related to global parenchymal volume loss without hydrocephalus. No extra-axial fluid collection. Major dural sinuses are grossly patent. Pituitary gland within normal limits. Vascular: Major intracranial vascular flow voids maintained. Skull and upper cervical spine: Mild degenerative ligamentous thickening noted at the craniocervical junction. Visualized upper cervical spine otherwise unremarkable. Bone marrow signal intensity within normal limits. No scalp soft tissue abnormality. Sinuses/Orbits: Globes and oval soft tissues within normal limits. Scattered mucosal thickening throughout the paranasal sinuses, greatest within the ethmoid air cells. No mastoid effusion. Inner ear structures normal. Other: 2.8 cm well-circumscribed T2 hyperintense mass noted within the left parotid gland, incompletely visualized. MRA HEAD FINDINGS ANTERIOR CIRCULATION: Distal cervical segments of the internal carotid arteries are patent with antegrade flow. Petrous, cavernous, supraclinoid segments widely patent without flow-limiting stenosis. Dominant right A1 segment widely patent. Left A1 segment hypoplastic but patent. This likely accounts for the slightly diminutive left ICA is compared to the right. Anterior communicating artery normal. Anterior cerebral arteries widely patent to their distal aspects. M1 segments widely patent without stenosis or occlusion. Distal MCA branches well opacified and symmetric. POSTERIOR CIRCULATION: Left vertebral artery dominant and widely patent to the  vertebrobasilar junction. Right vertebral artery hypoplastic but grossly patent as well. Posterior inferior cerebral arteries patent proximally. Basilar artery widely patent. Superior cerebellar and posterior cerebral arteries widely patent bilaterally. No aneurysm or vascular malformation. IMPRESSION: MRI HEAD IMPRESSION: 1. 19 mm acute nonhemorrhagic lacunar infarct involving the right basal ganglia. No associated mass effect. 2. Remote left occipital lobe infarct. 3. Generalized age-related cerebral atrophy. 4. 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 Nonemergent ENT consultation suggested. MRA HEAD IMPRESSION: Negative intracranial MRA. No large vessel occlusion. No high-grade or correctable stenosis. Electronically Signed   By: Jeannine Boga M.D.   On: 12/19/2016 17:59   Mr Jodene Nam Headm  Result Date: 12/19/2016 CLINICAL DATA:  Initial evaluation for acute left-sided weakness, facial droop. EXAM: MRI HEAD WITHOUT CONTRAST MRA HEAD WITHOUT CONTRAST TECHNIQUE: Multiplanar, multiecho pulse sequences of the brain and surrounding structures were obtained without intravenous contrast. Angiographic images of the head were obtained using MRA technique without contrast. COMPARISON:  Prior CT from earlier same day. FINDINGS: MRI HEAD FINDINGS Brain: Diffuse prominence of the CSF containing spaces is compatible with generalized age-related cerebral atrophy. No significant cerebral white matter disease for patient age. Focal encephalomalacia with gliosis within the left occipital lobe compatible with remote infarct. Small curvilinear focus of restricted diffusion extends from the posterior aspect of the right lentiform nucleus towards the right corona radiata, compatible with  a small acute lacunar infarct (series 4, image 28). Infarct measures 19 mm in length. No associated hemorrhage or mass effect. No other evidence for acute or subacute ischemia. No mass lesion, midline shift, or mass effect. Mild  ventricular prominence related to global parenchymal volume loss without hydrocephalus. No extra-axial fluid collection. Major dural sinuses are grossly patent. Pituitary gland within normal limits. Vascular: Major intracranial vascular flow voids maintained. Skull and upper cervical spine: Mild degenerative ligamentous thickening noted at the craniocervical junction. Visualized upper cervical spine otherwise unremarkable. Bone marrow signal intensity within normal limits. No scalp soft tissue abnormality. Sinuses/Orbits: Globes and oval soft tissues within normal limits. Scattered mucosal thickening throughout the paranasal sinuses, greatest within the ethmoid air cells. No mastoid effusion. Inner ear structures normal. Other: 2.8 cm well-circumscribed T2 hyperintense mass noted within the left parotid gland, incompletely visualized. MRA HEAD FINDINGS ANTERIOR CIRCULATION: Distal cervical segments of the internal carotid arteries are patent with antegrade flow. Petrous, cavernous, supraclinoid segments widely patent without flow-limiting stenosis. Dominant right A1 segment widely patent. Left A1 segment hypoplastic but patent. This likely accounts for the slightly diminutive left ICA is compared to the right. Anterior communicating artery normal. Anterior cerebral arteries widely patent to their distal aspects. M1 segments widely patent without stenosis or occlusion. Distal MCA branches well opacified and symmetric. POSTERIOR CIRCULATION: Left vertebral artery dominant and widely patent to the vertebrobasilar junction. Right vertebral artery hypoplastic but grossly patent as well. Posterior inferior cerebral arteries patent proximally. Basilar artery widely patent. Superior cerebellar and posterior cerebral arteries widely patent bilaterally. No aneurysm or vascular malformation. IMPRESSION: MRI HEAD IMPRESSION: 1. 19 mm acute nonhemorrhagic lacunar infarct involving the right basal ganglia. No associated mass  effect. 2. Remote left occipital lobe infarct. 3. Generalized age-related cerebral atrophy. 4. 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 Nonemergent ENT consultation suggested. MRA HEAD IMPRESSION: Negative intracranial MRA. No large vessel occlusion. No high-grade or correctable stenosis. Electronically Signed   By: Jeannine Boga M.D.   On: 12/19/2016 17:59        Scheduled Meds: .  stroke: mapping our early stages of recovery book   Does not apply Once  . aspirin  300 mg Rectal Daily   Or  . aspirin  325 mg Oral Daily  . divalproex  500 mg Oral BID  . febuxostat  40 mg Oral Daily  . heparin  5,000 Units Subcutaneous Q8H  . levothyroxine  100 mcg Oral QAC breakfast  . pantoprazole  80 mg Oral Daily  . pravastatin  40 mg Oral q1800  . sucralfate  1 g Oral BID  . zonisamide  100 mg Oral BID   Continuous Infusions:    LOS: 2 days     Zenith Kercheval, MD, FACP, FHM. [facility] Pager [phone]  If 7PM-7AM, please contact night-coverage [email or web address] Password [facility] 12/21/2016, 3:03 PM

## 2016-12-21 NOTE — Progress Notes (Signed)
STROKE TEAM PROGRESS NOTE   SUBJECTIVE (INTERVAL HISTORY) Her 2 daughters are at the bedside. They would like to take pt home with home therapy, declined SNF placement.     OBJECTIVE Temp:  [97.4 F (36.3 C)-98.7 F (37.1 C)] 97.7 F (36.5 C) (03/24 1452) Pulse Rate:  [55-64] 61 (03/24 1452) Cardiac Rhythm: Heart block (03/24 0700) Resp:  [16-17] 16 (03/24 1452) BP: (146-171)/(36-50) 153/50 (03/24 1452) SpO2:  [94 %-99 %] 94 % (03/24 1452)  CBC:   Recent Labs Lab 12/19/16 1105  12/20/16 0421 12/21/16 0714  WBC 6.9  --  5.8 7.1  NEUTROABS 3.0  --   --   --   HGB 12.5  < > 11.9* 12.9  HCT 39.2  < > 37.1 40.9  MCV 92.0  --  92.3 91.5  PLT 90*  --  89* 108*  < > = values in this interval not displayed.  Basic Metabolic Panel:   Recent Labs Lab 12/20/16 0421 12/21/16 0714  NA 142 143  K 4.0 3.9  CL 108 112*  CO2 24 25  GLUCOSE 73 79  BUN 35* 30*  CREATININE 1.25* 1.12*  CALCIUM 8.3* 8.7*   HgbA1c:  Lab Results  Component Value Date   HGBA1C 5.6 12/20/2016    Trans-thoracic Echocardiogram 12/20/2016  Study Conclusions - Left ventricle: The cavity size was normal. Systolic function was   vigorous. The estimated ejection fraction was in the range of 65% to 70%.    Wall motion was normal; there were no regional wall   motion abnormalities. Features are consistent with a pseudonormal   left ventricular filling pattern, with concomitant abnormal   relaxation and increased filling pressure (grade 2 diastolic   dysfunction). Doppler parameters are consistent with elevated   ventricular end-diastolic filling pressure. - Aortic valve: Transvalvular velocity was minimally increased.   There was very mild stenosis. There was moderate regurgitation.   Mean gradient (S): 9 mm Hg. Peak gradient (S): 17 mm Hg. Valve   area (VTI): 1.88 cm^2. Valve area (Vmax): 1.62 cm^2. Valve area   (Vmean): 1.89 cm^2. - Aortic root: The aortic root was normal in size. - Mitral valve:  Calcified annulus. Mildly thickened leaflets . - Right ventricle: The cavity size was normal. Wall thickness was   normal. Systolic function was normal. - Right atrium: The atrium was normal in size. - Tricuspid valve: There was moderate regurgitation. - Pulmonic valve: There was no regurgitation. - Pulmonary arteries: Systolic pressure was moderately increased.   PA peak pressure: 40 mm Hg (S). - Inferior vena cava: The vessel was normal in size. - Pericardium, extracardiac: There was no pericardial effusion. - Left atrium:  The atrium was normal in size.   PHYSICAL EXAM Pleasant elderly Caucasian lady not in distress.  . Afebrile. Head is nontraumatic. Neck is supple without bruit.    Cardiac exam no murmur or gallop. Lungs are clear to auscultation. Distal pulses are well felt. Neurological Exam : Awake alert oriented 3. Mild dysarthria but can be understood. Follows commands well. Extraocular moments are full range without nystagmus. Vision acuity and fields seem adequate. Mild left lower facial weakness. Tongue midline. Motor system exam reveals left UE 3/5 with drift as well as weak hand grip and intrinsic hand muscles. RUE 5/5, RLE 3-/5 and LLE 2/5. Deep tendon reflexes are symmetric plantars are downgoing.Sensation is intact. Coordination intact FTN bilaterally. Gait was not tested.    ASSESSMENT/PLAN Dana Harrington is a 76 y.o.  female with history of gout, hypertension, hypothyroidism, seizures presenting with L face, arm and leg weakness. She did not receive IV t-PA due to delay in arrival.   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 Old L occipital infarct. Atrophy. 2.8 cm L parotid lobe mass  MRA  Unremarkable   Carotid Doppler - unremarkable  2D Echo - EF 65-70%. No cardiac source of emboli identified.  LDL 57  HgbA1c - 5.6  Heparin 5000 units  sq tid for VTE prophylaxis DIET DYS 2 Room service appropriate? Yes; Fluid consistency: Thin  No antithrombotic prior to admission, now on aspirin 325 mg daily. Continue ASA on discharge.  Therapy recommendations:  CIR, however, pt family would like home therapy  Disposition:  Home today   Seizure disorder  Stable, no seizure  On depakote  Continue depakoet, zonigran  Last sz 1987  Pt follows with Dr. Roxy Horseman at [facility]  Continue to follow up with Dr. Roxy [facility]man at [facility]  Hypertension  Stable  Permissive hypertension (OK if < 220/120) but gradually normalize in 5-7 days  Long-term BP goal normotensive  Hyperlipidemia  Home meds:  pravachol 40, resumed in hospital  LDL 57 at goal  Continue statin at discharge  Other Stroke Risk Factors  Advanced age  Former Cigarette smoker  UDS positive for opiates and amphetamines (on Theraflu Cold which has phenylephrine, which could be related)  Other Active Problems  Hypothyroid  MSK pain/strain  GERD  Gout   Hospital day # 2  Neurology will sign off. Please call with questions. Pt will follow up with Dr. Roxy Horseman at [facility] in about 6 weeks. Thanks for the consult.  Rosalin Hawking, MD PhD Stroke Neurology 12/21/2016 4:30 PM   To contact Stroke Continuity provider, please refer to [email or web address] After hours, contact General Neurology

## 2016-12-21 NOTE — Progress Notes (Signed)
Notified bedside RN that PTAR packet in chart with number to call if needed for transport home.

## 2016-12-21 NOTE — Discharge Instructions (Addendum)
Please get your medications reviewed and adjusted by your Primary MD. ° °Please request your Primary MD to go over all Hospital Tests and Procedure/Radiological results at the follow up, please get all Hospital records sent to your Prim MD by signing hospital release before you go home. ° °If you had Pneumonia of Lung problems at the Hospital: °Please get a 2 view Chest X ray done in 6-8 weeks after hospital discharge or sooner if instructed by your Primary MD. ° °If you have Congestive Heart Failure: °Please call your Cardiologist or Primary MD anytime you have any of the following symptoms:  °1) 3 pound weight gain in 24 hours or 5 pounds in 1 week  °2) shortness of breath, with or without a dry hacking cough  °3) swelling in the hands, feet or stomach  °4) if you have to sleep on extra pillows at night in order to breathe ° °Follow cardiac low salt diet and 1.5 lit/day fluid restriction. ° °If you have diabetes °Accuchecks 4 times/day, Once in AM empty stomach and then before each meal. °Log in all results and show them to your primary doctor at your next visit. °If any glucose reading is under 80 or above 300 call your primary MD immediately. ° °If you have Seizure/Convulsions/Epilepsy: °Please do not drive, operate heavy machinery, participate in activities at heights or participate in high speed sports until you have seen by Primary MD or a Neurologist and advised to do so again. ° °If you had Gastrointestinal Bleeding: °Please ask your Primary MD to check a complete blood count within one week of discharge or at your next visit. Your endoscopic/colonoscopic biopsies that are pending at the time of discharge, will also need to followed by your Primary MD. ° °Get Medicines reviewed and adjusted. °Please take all your medications with you for your next visit with your Primary MD ° °Please request your Primary MD to go over all hospital tests and procedure/radiological results at the follow up, please ask your  Primary MD to get all Hospital records sent to his/her office. ° °If you experience worsening of your admission symptoms, develop shortness of breath, life threatening emergency, suicidal or homicidal thoughts you must seek medical attention immediately by calling 911 or calling your MD immediately  if symptoms less severe. ° °You must read complete instructions/literature along with all the possible adverse reactions/side effects for all the Medicines you take and that have been prescribed to you. Take any new Medicines after you have completely understood and accpet all the possible adverse reactions/side effects.  ° °Do not drive or operate heavy machinery when taking Pain medications.  ° °Do not take more than prescribed Pain, Sleep and Anxiety Medications ° °Special Instructions: If you have smoked or chewed Tobacco  in the last 2 yrs please stop smoking, stop any regular Alcohol  and or any Recreational drug use. ° °Wear Seat belts while driving. ° °Please note °You were cared for by a hospitalist during your hospital stay. If you have any questions about your discharge medications or the care you received while you were in the hospital after you are discharged, you can call the unit and asked to speak with the hospitalist on call if the hospitalist that took care of you is not available. Once you are discharged, your primary care physician will handle any further medical issues. Please note that NO REFILLS for any discharge medications will be authorized once you are discharged, as it is imperative that you   return to your primary care physician (or establish a relationship with a primary care physician if you do not have one) for your aftercare needs so that they can reassess your need for medications and monitor your lab values.  You can reach the hospitalist office at phone [phone] or fax [phone]   If you do not have a primary care physician, you can call [phone] for a physician  referral.   Dysphagia Diet Level 2, Mechanically Altered The dysphagia level 2 diet includes foods that are blended, chopped, ground, or mashed so they are easier to chew and swallow. The foods are soft, moist, and can be chopped into -inch chunks (such as pancakes, pasta, and bananas). In order to be on this diet, you must be able to chew. This diet helps you transition between the pureed textures of the dysphagia level 1 diet to more solid textures. This diet is helpful for people with mild to moderate swallowing difficulties. It reduces the risk of food getting caught in the windpipe, trachea, or lungs. You may need help or supervision during meals while following this diet so that you eat safely. You will be on this diet until your health care provider advances the texture of your diet. What do I need to know about this diet? Foods   You may eat foods that are soft and moist. ? You may need to use a blender, whisk, or masher to soften some of your foods. ? You can moisten foods with gravies, sauces, vegetable or fruit juice, milk, half and half, or water when blending, mashing, or grinding your foods to the right consistency.  If you were on the dysphagia level 1 diet, you may still eat any of the foods included in that diet.  Avoid foods that are dry, hard, sticky, chewy, coarse, and crunchy. Also avoid large cuts of food.  Take small bites. Each bite should contain  inch or less of food. Liquids   Avoid liquids with seeds and chunks.  Thicken liquids, if instructed by your health care provider. Your health care provider will tell you the consistency to which you should thicken your liquids for safe swallowing. To thicken a liquid, use a commercial thickener or a thickening food (such as rice cereal or potato flakes). Ask your health care provider for specific recommendations on thickeners. See your dietitian or health care provider regularly for help with your dietary changes. What  foods can I eat? Grains  Store-bought soft breads that do not have nuts or seeds. Pancakes, sweet rolls, [place] pastries, and [place] toast that have been moistened with syrup or sauce to form a slurry when blended. Well-cooked pasta, noodles, and bread dressing. Well-cooked noodles and pasta in sauce. Moist macaroni and cheese. Soft dumplings or spaetzle with gravy or butter. Cooked cereals (including oatmeal). Low-texture dry cereals, such as rice puff, corn, or wheat-flake cereals, with milk (if thin liquids are not allowed, make sure all of the milk is absorbed by the cereal before eating it). Vegetables  Very soft, well-cooked vegetables in pieces less than  inch in size. Cooked potatoes that are moist, not crispy, and with sauce. Fruits  Canned or cooked fruits that are soft or moist and do not have skin or seeds. Fresh, soft bananas. Fruit juices with a small amount of pulp (if thin liquids are allowed). Gelatin or plain gelatin with canned fruit, except pineapple. Meat and Other Protein Sources  Tender, moist meats, poultry, or fish cooked with gravy or sauce and  cubed to -inch bites or smaller. Ground meat. Moist meatball or meatloaf. Fish without bones. Moist casseroles without rice. Tuna, egg, or meat salad without chunks or hard-to-chew vegetables, such as celery and onions. Smooth quiche without large chunks. Scrambled, poached, or soft-cooked eggs with butter, margarine, sauce, or gravy. Tofu. Well-cooked, moistened and mashed beans, peas, baked beans, and other legumes. Casseroles without rice (such as tuna noodle casserole or soft moist meat lasagna). Dairy  Cream cheese. Yogurt. Cottage cheese. Ask your health care provider if milk is allowed. Sweets/Desserts  Pudding. Custard. Soft fruit pies with crust on the bottom only. Crisps and cobblers without seeds or nuts and with soft crusts. Soft, moist cakes. Icing. Pre-gelled cookies. Soft, moist cookies dunked in milk, coffee, or another  liquid. Jelly. Soft, smooth chocolate bars that are easily chewed. Jams and preserves without seeds. Ask your health care provider whether you can have frozen desserts. Fats and Oils  Butter. Margarine. Cream for cereal, depending on liquid consistency allowed. Gravy. Cream sauces. Mayonnaise. Salad dressings. Cream cheese. Cheese spreads, plain or with soft fruits or vegetables added. Sour cream. Sour cream dips with soft fruits or vegetables added. Whipped toppings. Other  Sauces and salsas that have soft chunks that are about  inch or smaller. The items listed above may not be a complete list of recommended foods or beverages. Contact your dietitian for more options.  What foods are not recommended? Grains  All breads not listed in the recommended list. Breads that are hard or have nuts or seeds. Coarse cereals. Cereals that have nuts, seeds, dried fruits, or coconut. Rice. Corn. Vegetables  Whole, raw, frozen, or dried vegetables. Tough, fibrous, chewy, or stringy cooked vegetables, such as celery, peas, broccoli, cabbage, Brussels sprouts, and asparagus. Potato skins. Potato and other vegetable chips. Fried or French-fried potatoes. Cooked corn and peas. Fruits  Whole raw, frozen, or dried fruits, including coconut. Pineapple. Fruits with seeds. Meat and Other Protein Sources  Dry, tough meats, such as bacon, sausage, and hot dogs. Cheese slices and cubes. Peanut butter. Hard boiled or fried eggs. Nuts. Seeds. Pizza. Sandwiches. Dry casseroles or casseroles with rice or large chunks. Dairy  Yogurt with nuts, seeds, or large chunks. Sweets/Desserts  Coarse, hard, chewy, or sticky desserts. Any dessert with nuts, seeds, coconut, pineapple, or dried fruit. Ask your health care provider whether you can have frozen desserts. Fats and Oils  Avoid fats with chunky, large textures, such as those with nuts or fruits. Other  Soups and casseroles with large chunks. The items listed above may not be  a complete list of foods and beverages to avoid. Contact your dietitian for more information.  This information is not intended to replace advice given to you by your health care provider. Make sure you discuss any questions you have with your health care provider. Document Released: 09/16/2005 Document Revised: 02/22/2016 Document Reviewed: 08/30/2013 Elsevier Interactive Patient Education  2017 [employer].

## 2016-12-21 NOTE — Progress Notes (Signed)
Pt d/c to home by ambulance. Assessment stable. All questions answered.

## 2016-12-23 DIAGNOSIS — I639 Cerebral infarction, unspecified: Secondary | ICD-10-CM | POA: Diagnosis not present

## 2016-12-23 NOTE — Progress Notes (Signed)
CSW received call from pt dtr- she is concerned that she has not heard from home health agency yet.  CSW called [facility] concerned equipment delivery and [facility] concerning home health services- they will reach out to family this morning  Jorge Ny, [place] Social Worker [phone]

## 2016-12-24 DIAGNOSIS — I10 Essential (primary) hypertension: Secondary | ICD-10-CM | POA: Diagnosis not present

## 2016-12-24 DIAGNOSIS — I69320 Aphasia following cerebral infarction: Secondary | ICD-10-CM | POA: Diagnosis not present

## 2016-12-24 DIAGNOSIS — I6350 Cerebral infarction due to unspecified occlusion or stenosis of unspecified cerebral artery: Secondary | ICD-10-CM | POA: Diagnosis not present

## 2016-12-24 DIAGNOSIS — I639 Cerebral infarction, unspecified: Secondary | ICD-10-CM | POA: Diagnosis not present

## 2016-12-24 DIAGNOSIS — I69392 Facial weakness following cerebral infarction: Secondary | ICD-10-CM | POA: Diagnosis not present

## 2016-12-26 DIAGNOSIS — I69392 Facial weakness following cerebral infarction: Secondary | ICD-10-CM | POA: Diagnosis not present

## 2016-12-26 DIAGNOSIS — I6350 Cerebral infarction due to unspecified occlusion or stenosis of unspecified cerebral artery: Secondary | ICD-10-CM | POA: Diagnosis not present

## 2016-12-26 DIAGNOSIS — I10 Essential (primary) hypertension: Secondary | ICD-10-CM | POA: Diagnosis not present

## 2016-12-26 DIAGNOSIS — I69320 Aphasia following cerebral infarction: Secondary | ICD-10-CM | POA: Diagnosis not present

## 2016-12-27 DIAGNOSIS — I69320 Aphasia following cerebral infarction: Secondary | ICD-10-CM | POA: Diagnosis not present

## 2016-12-27 DIAGNOSIS — I6350 Cerebral infarction due to unspecified occlusion or stenosis of unspecified cerebral artery: Secondary | ICD-10-CM | POA: Diagnosis not present

## 2016-12-27 DIAGNOSIS — I10 Essential (primary) hypertension: Secondary | ICD-10-CM | POA: Diagnosis not present

## 2016-12-27 DIAGNOSIS — I69392 Facial weakness following cerebral infarction: Secondary | ICD-10-CM | POA: Diagnosis not present

## 2016-12-31 DIAGNOSIS — I350 Nonrheumatic aortic (valve) stenosis: Secondary | ICD-10-CM | POA: Diagnosis not present

## 2016-12-31 DIAGNOSIS — M109 Gout, unspecified: Secondary | ICD-10-CM | POA: Diagnosis not present

## 2016-12-31 DIAGNOSIS — I69320 Aphasia following cerebral infarction: Secondary | ICD-10-CM | POA: Diagnosis not present

## 2016-12-31 DIAGNOSIS — Z7982 Long term (current) use of aspirin: Secondary | ICD-10-CM | POA: Diagnosis not present

## 2016-12-31 DIAGNOSIS — D631 Anemia in chronic kidney disease: Secondary | ICD-10-CM | POA: Diagnosis not present

## 2016-12-31 DIAGNOSIS — M170 Bilateral primary osteoarthritis of knee: Secondary | ICD-10-CM | POA: Diagnosis not present

## 2016-12-31 DIAGNOSIS — I69393 Ataxia following cerebral infarction: Secondary | ICD-10-CM | POA: Diagnosis not present

## 2016-12-31 DIAGNOSIS — I129 Hypertensive chronic kidney disease with stage 1 through stage 4 chronic kidney disease, or unspecified chronic kidney disease: Secondary | ICD-10-CM | POA: Diagnosis not present

## 2016-12-31 DIAGNOSIS — N183 Chronic kidney disease, stage 3 (moderate): Secondary | ICD-10-CM | POA: Diagnosis not present

## 2016-12-31 DIAGNOSIS — I69392 Facial weakness following cerebral infarction: Secondary | ICD-10-CM | POA: Diagnosis not present

## 2016-12-31 DIAGNOSIS — I69354 Hemiplegia and hemiparesis following cerebral infarction affecting left non-dominant side: Secondary | ICD-10-CM | POA: Diagnosis not present

## 2016-12-31 DIAGNOSIS — E039 Hypothyroidism, unspecified: Secondary | ICD-10-CM | POA: Diagnosis not present

## 2017-01-02 DIAGNOSIS — M109 Gout, unspecified: Secondary | ICD-10-CM | POA: Diagnosis not present

## 2017-01-02 DIAGNOSIS — E039 Hypothyroidism, unspecified: Secondary | ICD-10-CM | POA: Diagnosis not present

## 2017-01-02 DIAGNOSIS — I350 Nonrheumatic aortic (valve) stenosis: Secondary | ICD-10-CM | POA: Diagnosis not present

## 2017-01-02 DIAGNOSIS — I129 Hypertensive chronic kidney disease with stage 1 through stage 4 chronic kidney disease, or unspecified chronic kidney disease: Secondary | ICD-10-CM | POA: Diagnosis not present

## 2017-01-02 DIAGNOSIS — Z7982 Long term (current) use of aspirin: Secondary | ICD-10-CM | POA: Diagnosis not present

## 2017-01-02 DIAGNOSIS — M170 Bilateral primary osteoarthritis of knee: Secondary | ICD-10-CM | POA: Diagnosis not present

## 2017-01-02 DIAGNOSIS — I69392 Facial weakness following cerebral infarction: Secondary | ICD-10-CM | POA: Diagnosis not present

## 2017-01-02 DIAGNOSIS — N183 Chronic kidney disease, stage 3 (moderate): Secondary | ICD-10-CM | POA: Diagnosis not present

## 2017-01-02 DIAGNOSIS — I69354 Hemiplegia and hemiparesis following cerebral infarction affecting left non-dominant side: Secondary | ICD-10-CM | POA: Diagnosis not present

## 2017-01-02 DIAGNOSIS — D631 Anemia in chronic kidney disease: Secondary | ICD-10-CM | POA: Diagnosis not present

## 2017-01-02 DIAGNOSIS — I69320 Aphasia following cerebral infarction: Secondary | ICD-10-CM | POA: Diagnosis not present

## 2017-01-02 DIAGNOSIS — I69393 Ataxia following cerebral infarction: Secondary | ICD-10-CM | POA: Diagnosis not present

## 2017-01-03 DIAGNOSIS — I350 Nonrheumatic aortic (valve) stenosis: Secondary | ICD-10-CM | POA: Diagnosis not present

## 2017-01-03 DIAGNOSIS — M170 Bilateral primary osteoarthritis of knee: Secondary | ICD-10-CM | POA: Diagnosis not present

## 2017-01-03 DIAGNOSIS — D631 Anemia in chronic kidney disease: Secondary | ICD-10-CM | POA: Diagnosis not present

## 2017-01-03 DIAGNOSIS — N183 Chronic kidney disease, stage 3 (moderate): Secondary | ICD-10-CM | POA: Diagnosis not present

## 2017-01-03 DIAGNOSIS — I69393 Ataxia following cerebral infarction: Secondary | ICD-10-CM | POA: Diagnosis not present

## 2017-01-03 DIAGNOSIS — I69354 Hemiplegia and hemiparesis following cerebral infarction affecting left non-dominant side: Secondary | ICD-10-CM | POA: Diagnosis not present

## 2017-01-03 DIAGNOSIS — M109 Gout, unspecified: Secondary | ICD-10-CM | POA: Diagnosis not present

## 2017-01-03 DIAGNOSIS — I69392 Facial weakness following cerebral infarction: Secondary | ICD-10-CM | POA: Diagnosis not present

## 2017-01-03 DIAGNOSIS — E039 Hypothyroidism, unspecified: Secondary | ICD-10-CM | POA: Diagnosis not present

## 2017-01-03 DIAGNOSIS — I129 Hypertensive chronic kidney disease with stage 1 through stage 4 chronic kidney disease, or unspecified chronic kidney disease: Secondary | ICD-10-CM | POA: Diagnosis not present

## 2017-01-03 DIAGNOSIS — Z7982 Long term (current) use of aspirin: Secondary | ICD-10-CM | POA: Diagnosis not present

## 2017-01-03 DIAGNOSIS — I69320 Aphasia following cerebral infarction: Secondary | ICD-10-CM | POA: Diagnosis not present

## 2017-01-04 DIAGNOSIS — I69320 Aphasia following cerebral infarction: Secondary | ICD-10-CM | POA: Diagnosis not present

## 2017-01-04 DIAGNOSIS — E039 Hypothyroidism, unspecified: Secondary | ICD-10-CM | POA: Diagnosis not present

## 2017-01-04 DIAGNOSIS — I350 Nonrheumatic aortic (valve) stenosis: Secondary | ICD-10-CM | POA: Diagnosis not present

## 2017-01-04 DIAGNOSIS — Z7982 Long term (current) use of aspirin: Secondary | ICD-10-CM | POA: Diagnosis not present

## 2017-01-04 DIAGNOSIS — N183 Chronic kidney disease, stage 3 (moderate): Secondary | ICD-10-CM | POA: Diagnosis not present

## 2017-01-04 DIAGNOSIS — I129 Hypertensive chronic kidney disease with stage 1 through stage 4 chronic kidney disease, or unspecified chronic kidney disease: Secondary | ICD-10-CM | POA: Diagnosis not present

## 2017-01-04 DIAGNOSIS — I69393 Ataxia following cerebral infarction: Secondary | ICD-10-CM | POA: Diagnosis not present

## 2017-01-04 DIAGNOSIS — M109 Gout, unspecified: Secondary | ICD-10-CM | POA: Diagnosis not present

## 2017-01-04 DIAGNOSIS — I69392 Facial weakness following cerebral infarction: Secondary | ICD-10-CM | POA: Diagnosis not present

## 2017-01-04 DIAGNOSIS — I69354 Hemiplegia and hemiparesis following cerebral infarction affecting left non-dominant side: Secondary | ICD-10-CM | POA: Diagnosis not present

## 2017-01-04 DIAGNOSIS — M170 Bilateral primary osteoarthritis of knee: Secondary | ICD-10-CM | POA: Diagnosis not present

## 2017-01-04 DIAGNOSIS — D631 Anemia in chronic kidney disease: Secondary | ICD-10-CM | POA: Diagnosis not present

## 2017-01-06 DIAGNOSIS — N183 Chronic kidney disease, stage 3 (moderate): Secondary | ICD-10-CM | POA: Diagnosis not present

## 2017-01-06 DIAGNOSIS — I69354 Hemiplegia and hemiparesis following cerebral infarction affecting left non-dominant side: Secondary | ICD-10-CM | POA: Diagnosis not present

## 2017-01-06 DIAGNOSIS — D631 Anemia in chronic kidney disease: Secondary | ICD-10-CM | POA: Diagnosis not present

## 2017-01-06 DIAGNOSIS — I69393 Ataxia following cerebral infarction: Secondary | ICD-10-CM | POA: Diagnosis not present

## 2017-01-06 DIAGNOSIS — Z7982 Long term (current) use of aspirin: Secondary | ICD-10-CM | POA: Diagnosis not present

## 2017-01-06 DIAGNOSIS — I350 Nonrheumatic aortic (valve) stenosis: Secondary | ICD-10-CM | POA: Diagnosis not present

## 2017-01-06 DIAGNOSIS — E039 Hypothyroidism, unspecified: Secondary | ICD-10-CM | POA: Diagnosis not present

## 2017-01-06 DIAGNOSIS — I69392 Facial weakness following cerebral infarction: Secondary | ICD-10-CM | POA: Diagnosis not present

## 2017-01-06 DIAGNOSIS — M109 Gout, unspecified: Secondary | ICD-10-CM | POA: Diagnosis not present

## 2017-01-06 DIAGNOSIS — M170 Bilateral primary osteoarthritis of knee: Secondary | ICD-10-CM | POA: Diagnosis not present

## 2017-01-06 DIAGNOSIS — I129 Hypertensive chronic kidney disease with stage 1 through stage 4 chronic kidney disease, or unspecified chronic kidney disease: Secondary | ICD-10-CM | POA: Diagnosis not present

## 2017-01-06 DIAGNOSIS — I69320 Aphasia following cerebral infarction: Secondary | ICD-10-CM | POA: Diagnosis not present

## 2017-01-09 DIAGNOSIS — I69393 Ataxia following cerebral infarction: Secondary | ICD-10-CM | POA: Diagnosis not present

## 2017-01-09 DIAGNOSIS — D631 Anemia in chronic kidney disease: Secondary | ICD-10-CM | POA: Diagnosis not present

## 2017-01-09 DIAGNOSIS — I350 Nonrheumatic aortic (valve) stenosis: Secondary | ICD-10-CM | POA: Diagnosis not present

## 2017-01-09 DIAGNOSIS — I69354 Hemiplegia and hemiparesis following cerebral infarction affecting left non-dominant side: Secondary | ICD-10-CM | POA: Diagnosis not present

## 2017-01-09 DIAGNOSIS — M109 Gout, unspecified: Secondary | ICD-10-CM | POA: Diagnosis not present

## 2017-01-09 DIAGNOSIS — I69392 Facial weakness following cerebral infarction: Secondary | ICD-10-CM | POA: Diagnosis not present

## 2017-01-09 DIAGNOSIS — M170 Bilateral primary osteoarthritis of knee: Secondary | ICD-10-CM | POA: Diagnosis not present

## 2017-01-09 DIAGNOSIS — N183 Chronic kidney disease, stage 3 (moderate): Secondary | ICD-10-CM | POA: Diagnosis not present

## 2017-01-09 DIAGNOSIS — E039 Hypothyroidism, unspecified: Secondary | ICD-10-CM | POA: Diagnosis not present

## 2017-01-09 DIAGNOSIS — Z7982 Long term (current) use of aspirin: Secondary | ICD-10-CM | POA: Diagnosis not present

## 2017-01-09 DIAGNOSIS — I129 Hypertensive chronic kidney disease with stage 1 through stage 4 chronic kidney disease, or unspecified chronic kidney disease: Secondary | ICD-10-CM | POA: Diagnosis not present

## 2017-01-09 DIAGNOSIS — I69320 Aphasia following cerebral infarction: Secondary | ICD-10-CM | POA: Diagnosis not present

## 2017-01-13 DIAGNOSIS — D631 Anemia in chronic kidney disease: Secondary | ICD-10-CM | POA: Diagnosis not present

## 2017-01-13 DIAGNOSIS — N183 Chronic kidney disease, stage 3 (moderate): Secondary | ICD-10-CM | POA: Diagnosis not present

## 2017-01-13 DIAGNOSIS — I69320 Aphasia following cerebral infarction: Secondary | ICD-10-CM | POA: Diagnosis not present

## 2017-01-13 DIAGNOSIS — Z7982 Long term (current) use of aspirin: Secondary | ICD-10-CM | POA: Diagnosis not present

## 2017-01-13 DIAGNOSIS — I69354 Hemiplegia and hemiparesis following cerebral infarction affecting left non-dominant side: Secondary | ICD-10-CM | POA: Diagnosis not present

## 2017-01-13 DIAGNOSIS — I69393 Ataxia following cerebral infarction: Secondary | ICD-10-CM | POA: Diagnosis not present

## 2017-01-13 DIAGNOSIS — I129 Hypertensive chronic kidney disease with stage 1 through stage 4 chronic kidney disease, or unspecified chronic kidney disease: Secondary | ICD-10-CM | POA: Diagnosis not present

## 2017-01-13 DIAGNOSIS — I350 Nonrheumatic aortic (valve) stenosis: Secondary | ICD-10-CM | POA: Diagnosis not present

## 2017-01-13 DIAGNOSIS — M109 Gout, unspecified: Secondary | ICD-10-CM | POA: Diagnosis not present

## 2017-01-13 DIAGNOSIS — M170 Bilateral primary osteoarthritis of knee: Secondary | ICD-10-CM | POA: Diagnosis not present

## 2017-01-13 DIAGNOSIS — I69392 Facial weakness following cerebral infarction: Secondary | ICD-10-CM | POA: Diagnosis not present

## 2017-01-13 DIAGNOSIS — E039 Hypothyroidism, unspecified: Secondary | ICD-10-CM | POA: Diagnosis not present

## 2017-01-14 DIAGNOSIS — I129 Hypertensive chronic kidney disease with stage 1 through stage 4 chronic kidney disease, or unspecified chronic kidney disease: Secondary | ICD-10-CM | POA: Diagnosis not present

## 2017-01-14 DIAGNOSIS — N183 Chronic kidney disease, stage 3 (moderate): Secondary | ICD-10-CM | POA: Diagnosis not present

## 2017-01-14 DIAGNOSIS — M109 Gout, unspecified: Secondary | ICD-10-CM | POA: Diagnosis not present

## 2017-01-14 DIAGNOSIS — E039 Hypothyroidism, unspecified: Secondary | ICD-10-CM | POA: Diagnosis not present

## 2017-01-14 DIAGNOSIS — Z7982 Long term (current) use of aspirin: Secondary | ICD-10-CM | POA: Diagnosis not present

## 2017-01-14 DIAGNOSIS — I69393 Ataxia following cerebral infarction: Secondary | ICD-10-CM | POA: Diagnosis not present

## 2017-01-14 DIAGNOSIS — I69392 Facial weakness following cerebral infarction: Secondary | ICD-10-CM | POA: Diagnosis not present

## 2017-01-14 DIAGNOSIS — M170 Bilateral primary osteoarthritis of knee: Secondary | ICD-10-CM | POA: Diagnosis not present

## 2017-01-14 DIAGNOSIS — I350 Nonrheumatic aortic (valve) stenosis: Secondary | ICD-10-CM | POA: Diagnosis not present

## 2017-01-14 DIAGNOSIS — I69320 Aphasia following cerebral infarction: Secondary | ICD-10-CM | POA: Diagnosis not present

## 2017-01-14 DIAGNOSIS — I69354 Hemiplegia and hemiparesis following cerebral infarction affecting left non-dominant side: Secondary | ICD-10-CM | POA: Diagnosis not present

## 2017-01-14 DIAGNOSIS — D631 Anemia in chronic kidney disease: Secondary | ICD-10-CM | POA: Diagnosis not present

## 2017-01-16 DIAGNOSIS — I69393 Ataxia following cerebral infarction: Secondary | ICD-10-CM | POA: Diagnosis not present

## 2017-01-16 DIAGNOSIS — M170 Bilateral primary osteoarthritis of knee: Secondary | ICD-10-CM | POA: Diagnosis not present

## 2017-01-16 DIAGNOSIS — E039 Hypothyroidism, unspecified: Secondary | ICD-10-CM | POA: Diagnosis not present

## 2017-01-16 DIAGNOSIS — Z7982 Long term (current) use of aspirin: Secondary | ICD-10-CM | POA: Diagnosis not present

## 2017-01-16 DIAGNOSIS — I350 Nonrheumatic aortic (valve) stenosis: Secondary | ICD-10-CM | POA: Diagnosis not present

## 2017-01-16 DIAGNOSIS — N183 Chronic kidney disease, stage 3 (moderate): Secondary | ICD-10-CM | POA: Diagnosis not present

## 2017-01-16 DIAGNOSIS — D631 Anemia in chronic kidney disease: Secondary | ICD-10-CM | POA: Diagnosis not present

## 2017-01-16 DIAGNOSIS — M109 Gout, unspecified: Secondary | ICD-10-CM | POA: Diagnosis not present

## 2017-01-16 DIAGNOSIS — I129 Hypertensive chronic kidney disease with stage 1 through stage 4 chronic kidney disease, or unspecified chronic kidney disease: Secondary | ICD-10-CM | POA: Diagnosis not present

## 2017-01-16 DIAGNOSIS — I69354 Hemiplegia and hemiparesis following cerebral infarction affecting left non-dominant side: Secondary | ICD-10-CM | POA: Diagnosis not present

## 2017-01-16 DIAGNOSIS — I69320 Aphasia following cerebral infarction: Secondary | ICD-10-CM | POA: Diagnosis not present

## 2017-01-16 DIAGNOSIS — I69392 Facial weakness following cerebral infarction: Secondary | ICD-10-CM | POA: Diagnosis not present

## 2017-01-21 DIAGNOSIS — I69320 Aphasia following cerebral infarction: Secondary | ICD-10-CM | POA: Diagnosis not present

## 2017-01-21 DIAGNOSIS — D631 Anemia in chronic kidney disease: Secondary | ICD-10-CM | POA: Diagnosis not present

## 2017-01-21 DIAGNOSIS — I69393 Ataxia following cerebral infarction: Secondary | ICD-10-CM | POA: Diagnosis not present

## 2017-01-21 DIAGNOSIS — I350 Nonrheumatic aortic (valve) stenosis: Secondary | ICD-10-CM | POA: Diagnosis not present

## 2017-01-21 DIAGNOSIS — N183 Chronic kidney disease, stage 3 (moderate): Secondary | ICD-10-CM | POA: Diagnosis not present

## 2017-01-21 DIAGNOSIS — I69392 Facial weakness following cerebral infarction: Secondary | ICD-10-CM | POA: Diagnosis not present

## 2017-01-21 DIAGNOSIS — M170 Bilateral primary osteoarthritis of knee: Secondary | ICD-10-CM | POA: Diagnosis not present

## 2017-01-21 DIAGNOSIS — M109 Gout, unspecified: Secondary | ICD-10-CM | POA: Diagnosis not present

## 2017-01-21 DIAGNOSIS — I129 Hypertensive chronic kidney disease with stage 1 through stage 4 chronic kidney disease, or unspecified chronic kidney disease: Secondary | ICD-10-CM | POA: Diagnosis not present

## 2017-01-21 DIAGNOSIS — Z7982 Long term (current) use of aspirin: Secondary | ICD-10-CM | POA: Diagnosis not present

## 2017-01-21 DIAGNOSIS — I69354 Hemiplegia and hemiparesis following cerebral infarction affecting left non-dominant side: Secondary | ICD-10-CM | POA: Diagnosis not present

## 2017-01-21 DIAGNOSIS — E039 Hypothyroidism, unspecified: Secondary | ICD-10-CM | POA: Diagnosis not present

## 2017-01-23 DIAGNOSIS — I69393 Ataxia following cerebral infarction: Secondary | ICD-10-CM | POA: Diagnosis not present

## 2017-01-23 DIAGNOSIS — E039 Hypothyroidism, unspecified: Secondary | ICD-10-CM | POA: Diagnosis not present

## 2017-01-23 DIAGNOSIS — I69392 Facial weakness following cerebral infarction: Secondary | ICD-10-CM | POA: Diagnosis not present

## 2017-01-23 DIAGNOSIS — Z7982 Long term (current) use of aspirin: Secondary | ICD-10-CM | POA: Diagnosis not present

## 2017-01-23 DIAGNOSIS — N183 Chronic kidney disease, stage 3 (moderate): Secondary | ICD-10-CM | POA: Diagnosis not present

## 2017-01-23 DIAGNOSIS — I69320 Aphasia following cerebral infarction: Secondary | ICD-10-CM | POA: Diagnosis not present

## 2017-01-23 DIAGNOSIS — I639 Cerebral infarction, unspecified: Secondary | ICD-10-CM | POA: Diagnosis not present

## 2017-01-23 DIAGNOSIS — M170 Bilateral primary osteoarthritis of knee: Secondary | ICD-10-CM | POA: Diagnosis not present

## 2017-01-23 DIAGNOSIS — D631 Anemia in chronic kidney disease: Secondary | ICD-10-CM | POA: Diagnosis not present

## 2017-01-23 DIAGNOSIS — I129 Hypertensive chronic kidney disease with stage 1 through stage 4 chronic kidney disease, or unspecified chronic kidney disease: Secondary | ICD-10-CM | POA: Diagnosis not present

## 2017-01-23 DIAGNOSIS — I350 Nonrheumatic aortic (valve) stenosis: Secondary | ICD-10-CM | POA: Diagnosis not present

## 2017-01-23 DIAGNOSIS — I69354 Hemiplegia and hemiparesis following cerebral infarction affecting left non-dominant side: Secondary | ICD-10-CM | POA: Diagnosis not present

## 2017-01-23 DIAGNOSIS — M109 Gout, unspecified: Secondary | ICD-10-CM | POA: Diagnosis not present

## 2017-01-24 DIAGNOSIS — I639 Cerebral infarction, unspecified: Secondary | ICD-10-CM | POA: Diagnosis not present

## 2017-01-28 DIAGNOSIS — E039 Hypothyroidism, unspecified: Secondary | ICD-10-CM | POA: Diagnosis not present

## 2017-01-28 DIAGNOSIS — I69320 Aphasia following cerebral infarction: Secondary | ICD-10-CM | POA: Diagnosis not present

## 2017-01-28 DIAGNOSIS — M109 Gout, unspecified: Secondary | ICD-10-CM | POA: Diagnosis not present

## 2017-01-28 DIAGNOSIS — Z7982 Long term (current) use of aspirin: Secondary | ICD-10-CM | POA: Diagnosis not present

## 2017-01-28 DIAGNOSIS — I129 Hypertensive chronic kidney disease with stage 1 through stage 4 chronic kidney disease, or unspecified chronic kidney disease: Secondary | ICD-10-CM | POA: Diagnosis not present

## 2017-01-28 DIAGNOSIS — I69393 Ataxia following cerebral infarction: Secondary | ICD-10-CM | POA: Diagnosis not present

## 2017-01-28 DIAGNOSIS — I69354 Hemiplegia and hemiparesis following cerebral infarction affecting left non-dominant side: Secondary | ICD-10-CM | POA: Diagnosis not present

## 2017-01-28 DIAGNOSIS — I350 Nonrheumatic aortic (valve) stenosis: Secondary | ICD-10-CM | POA: Diagnosis not present

## 2017-01-28 DIAGNOSIS — M170 Bilateral primary osteoarthritis of knee: Secondary | ICD-10-CM | POA: Diagnosis not present

## 2017-01-28 DIAGNOSIS — D631 Anemia in chronic kidney disease: Secondary | ICD-10-CM | POA: Diagnosis not present

## 2017-01-28 DIAGNOSIS — I69392 Facial weakness following cerebral infarction: Secondary | ICD-10-CM | POA: Diagnosis not present

## 2017-01-28 DIAGNOSIS — N183 Chronic kidney disease, stage 3 (moderate): Secondary | ICD-10-CM | POA: Diagnosis not present

## 2017-01-30 DIAGNOSIS — E559 Vitamin D deficiency, unspecified: Secondary | ICD-10-CM | POA: Diagnosis not present

## 2017-01-30 DIAGNOSIS — D631 Anemia in chronic kidney disease: Secondary | ICD-10-CM | POA: Diagnosis not present

## 2017-01-30 DIAGNOSIS — I69354 Hemiplegia and hemiparesis following cerebral infarction affecting left non-dominant side: Secondary | ICD-10-CM | POA: Diagnosis not present

## 2017-01-30 DIAGNOSIS — E039 Hypothyroidism, unspecified: Secondary | ICD-10-CM | POA: Diagnosis not present

## 2017-01-30 DIAGNOSIS — I69320 Aphasia following cerebral infarction: Secondary | ICD-10-CM | POA: Diagnosis not present

## 2017-01-30 DIAGNOSIS — M109 Gout, unspecified: Secondary | ICD-10-CM | POA: Diagnosis not present

## 2017-01-30 DIAGNOSIS — Z7982 Long term (current) use of aspirin: Secondary | ICD-10-CM | POA: Diagnosis not present

## 2017-01-30 DIAGNOSIS — I69392 Facial weakness following cerebral infarction: Secondary | ICD-10-CM | POA: Diagnosis not present

## 2017-01-30 DIAGNOSIS — I69393 Ataxia following cerebral infarction: Secondary | ICD-10-CM | POA: Diagnosis not present

## 2017-01-30 DIAGNOSIS — N183 Chronic kidney disease, stage 3 (moderate): Secondary | ICD-10-CM | POA: Diagnosis not present

## 2017-01-30 DIAGNOSIS — M170 Bilateral primary osteoarthritis of knee: Secondary | ICD-10-CM | POA: Diagnosis not present

## 2017-01-30 DIAGNOSIS — I129 Hypertensive chronic kidney disease with stage 1 through stage 4 chronic kidney disease, or unspecified chronic kidney disease: Secondary | ICD-10-CM | POA: Diagnosis not present

## 2017-01-30 DIAGNOSIS — I350 Nonrheumatic aortic (valve) stenosis: Secondary | ICD-10-CM | POA: Diagnosis not present

## 2017-01-31 DIAGNOSIS — Z5181 Encounter for therapeutic drug level monitoring: Secondary | ICD-10-CM | POA: Diagnosis not present

## 2017-02-04 DIAGNOSIS — I69320 Aphasia following cerebral infarction: Secondary | ICD-10-CM | POA: Diagnosis not present

## 2017-02-04 DIAGNOSIS — I69393 Ataxia following cerebral infarction: Secondary | ICD-10-CM | POA: Diagnosis not present

## 2017-02-04 DIAGNOSIS — M170 Bilateral primary osteoarthritis of knee: Secondary | ICD-10-CM | POA: Diagnosis not present

## 2017-02-04 DIAGNOSIS — I69392 Facial weakness following cerebral infarction: Secondary | ICD-10-CM | POA: Diagnosis not present

## 2017-02-04 DIAGNOSIS — I69354 Hemiplegia and hemiparesis following cerebral infarction affecting left non-dominant side: Secondary | ICD-10-CM | POA: Diagnosis not present

## 2017-02-04 DIAGNOSIS — N183 Chronic kidney disease, stage 3 (moderate): Secondary | ICD-10-CM | POA: Diagnosis not present

## 2017-02-04 DIAGNOSIS — Z7982 Long term (current) use of aspirin: Secondary | ICD-10-CM | POA: Diagnosis not present

## 2017-02-04 DIAGNOSIS — D631 Anemia in chronic kidney disease: Secondary | ICD-10-CM | POA: Diagnosis not present

## 2017-02-04 DIAGNOSIS — E039 Hypothyroidism, unspecified: Secondary | ICD-10-CM | POA: Diagnosis not present

## 2017-02-04 DIAGNOSIS — I350 Nonrheumatic aortic (valve) stenosis: Secondary | ICD-10-CM | POA: Diagnosis not present

## 2017-02-04 DIAGNOSIS — M109 Gout, unspecified: Secondary | ICD-10-CM | POA: Diagnosis not present

## 2017-02-04 DIAGNOSIS — I129 Hypertensive chronic kidney disease with stage 1 through stage 4 chronic kidney disease, or unspecified chronic kidney disease: Secondary | ICD-10-CM | POA: Diagnosis not present

## 2017-02-06 DIAGNOSIS — I10 Essential (primary) hypertension: Secondary | ICD-10-CM | POA: Diagnosis not present

## 2017-02-06 DIAGNOSIS — M109 Gout, unspecified: Secondary | ICD-10-CM | POA: Diagnosis not present

## 2017-02-06 DIAGNOSIS — E559 Vitamin D deficiency, unspecified: Secondary | ICD-10-CM | POA: Diagnosis not present

## 2017-02-07 DIAGNOSIS — D631 Anemia in chronic kidney disease: Secondary | ICD-10-CM | POA: Diagnosis not present

## 2017-02-07 DIAGNOSIS — Z7982 Long term (current) use of aspirin: Secondary | ICD-10-CM | POA: Diagnosis not present

## 2017-02-07 DIAGNOSIS — I350 Nonrheumatic aortic (valve) stenosis: Secondary | ICD-10-CM | POA: Diagnosis not present

## 2017-02-07 DIAGNOSIS — M170 Bilateral primary osteoarthritis of knee: Secondary | ICD-10-CM | POA: Diagnosis not present

## 2017-02-07 DIAGNOSIS — E039 Hypothyroidism, unspecified: Secondary | ICD-10-CM | POA: Diagnosis not present

## 2017-02-07 DIAGNOSIS — I129 Hypertensive chronic kidney disease with stage 1 through stage 4 chronic kidney disease, or unspecified chronic kidney disease: Secondary | ICD-10-CM | POA: Diagnosis not present

## 2017-02-07 DIAGNOSIS — I69392 Facial weakness following cerebral infarction: Secondary | ICD-10-CM | POA: Diagnosis not present

## 2017-02-07 DIAGNOSIS — M109 Gout, unspecified: Secondary | ICD-10-CM | POA: Diagnosis not present

## 2017-02-07 DIAGNOSIS — I69393 Ataxia following cerebral infarction: Secondary | ICD-10-CM | POA: Diagnosis not present

## 2017-02-07 DIAGNOSIS — I69354 Hemiplegia and hemiparesis following cerebral infarction affecting left non-dominant side: Secondary | ICD-10-CM | POA: Diagnosis not present

## 2017-02-07 DIAGNOSIS — I69320 Aphasia following cerebral infarction: Secondary | ICD-10-CM | POA: Diagnosis not present

## 2017-02-07 DIAGNOSIS — N183 Chronic kidney disease, stage 3 (moderate): Secondary | ICD-10-CM | POA: Diagnosis not present

## 2017-02-11 DIAGNOSIS — I69354 Hemiplegia and hemiparesis following cerebral infarction affecting left non-dominant side: Secondary | ICD-10-CM | POA: Diagnosis not present

## 2017-02-11 DIAGNOSIS — N183 Chronic kidney disease, stage 3 (moderate): Secondary | ICD-10-CM | POA: Diagnosis not present

## 2017-02-11 DIAGNOSIS — I69393 Ataxia following cerebral infarction: Secondary | ICD-10-CM | POA: Diagnosis not present

## 2017-02-11 DIAGNOSIS — I10 Essential (primary) hypertension: Secondary | ICD-10-CM | POA: Diagnosis not present

## 2017-02-11 DIAGNOSIS — M109 Gout, unspecified: Secondary | ICD-10-CM | POA: Diagnosis not present

## 2017-02-11 DIAGNOSIS — M170 Bilateral primary osteoarthritis of knee: Secondary | ICD-10-CM | POA: Diagnosis not present

## 2017-02-11 DIAGNOSIS — Z7982 Long term (current) use of aspirin: Secondary | ICD-10-CM | POA: Diagnosis not present

## 2017-02-11 DIAGNOSIS — I350 Nonrheumatic aortic (valve) stenosis: Secondary | ICD-10-CM | POA: Diagnosis not present

## 2017-02-11 DIAGNOSIS — I69392 Facial weakness following cerebral infarction: Secondary | ICD-10-CM | POA: Diagnosis not present

## 2017-02-11 DIAGNOSIS — D631 Anemia in chronic kidney disease: Secondary | ICD-10-CM | POA: Diagnosis not present

## 2017-02-11 DIAGNOSIS — E039 Hypothyroidism, unspecified: Secondary | ICD-10-CM | POA: Diagnosis not present

## 2017-02-11 DIAGNOSIS — I129 Hypertensive chronic kidney disease with stage 1 through stage 4 chronic kidney disease, or unspecified chronic kidney disease: Secondary | ICD-10-CM | POA: Diagnosis not present

## 2017-02-11 DIAGNOSIS — I69320 Aphasia following cerebral infarction: Secondary | ICD-10-CM | POA: Diagnosis not present

## 2017-02-13 DIAGNOSIS — I69392 Facial weakness following cerebral infarction: Secondary | ICD-10-CM | POA: Diagnosis not present

## 2017-02-13 DIAGNOSIS — I69354 Hemiplegia and hemiparesis following cerebral infarction affecting left non-dominant side: Secondary | ICD-10-CM | POA: Diagnosis not present

## 2017-02-13 DIAGNOSIS — D631 Anemia in chronic kidney disease: Secondary | ICD-10-CM | POA: Diagnosis not present

## 2017-02-13 DIAGNOSIS — M109 Gout, unspecified: Secondary | ICD-10-CM | POA: Diagnosis not present

## 2017-02-13 DIAGNOSIS — E039 Hypothyroidism, unspecified: Secondary | ICD-10-CM | POA: Diagnosis not present

## 2017-02-13 DIAGNOSIS — I69393 Ataxia following cerebral infarction: Secondary | ICD-10-CM | POA: Diagnosis not present

## 2017-02-13 DIAGNOSIS — Z7982 Long term (current) use of aspirin: Secondary | ICD-10-CM | POA: Diagnosis not present

## 2017-02-13 DIAGNOSIS — I69320 Aphasia following cerebral infarction: Secondary | ICD-10-CM | POA: Diagnosis not present

## 2017-02-13 DIAGNOSIS — N183 Chronic kidney disease, stage 3 (moderate): Secondary | ICD-10-CM | POA: Diagnosis not present

## 2017-02-13 DIAGNOSIS — M170 Bilateral primary osteoarthritis of knee: Secondary | ICD-10-CM | POA: Diagnosis not present

## 2017-02-13 DIAGNOSIS — I350 Nonrheumatic aortic (valve) stenosis: Secondary | ICD-10-CM | POA: Diagnosis not present

## 2017-02-13 DIAGNOSIS — I129 Hypertensive chronic kidney disease with stage 1 through stage 4 chronic kidney disease, or unspecified chronic kidney disease: Secondary | ICD-10-CM | POA: Diagnosis not present

## 2017-02-17 DIAGNOSIS — M25561 Pain in right knee: Secondary | ICD-10-CM | POA: Diagnosis not present

## 2017-02-17 DIAGNOSIS — M1711 Unilateral primary osteoarthritis, right knee: Secondary | ICD-10-CM | POA: Diagnosis not present

## 2017-02-17 DIAGNOSIS — M1712 Unilateral primary osteoarthritis, left knee: Secondary | ICD-10-CM | POA: Diagnosis not present

## 2017-02-17 DIAGNOSIS — M25562 Pain in left knee: Secondary | ICD-10-CM | POA: Diagnosis not present

## 2017-02-17 DIAGNOSIS — M545 Low back pain: Secondary | ICD-10-CM | POA: Diagnosis not present

## 2017-02-17 DIAGNOSIS — G8929 Other chronic pain: Secondary | ICD-10-CM | POA: Diagnosis not present

## 2017-02-20 DIAGNOSIS — D631 Anemia in chronic kidney disease: Secondary | ICD-10-CM | POA: Diagnosis not present

## 2017-02-20 DIAGNOSIS — I350 Nonrheumatic aortic (valve) stenosis: Secondary | ICD-10-CM | POA: Diagnosis not present

## 2017-02-20 DIAGNOSIS — I129 Hypertensive chronic kidney disease with stage 1 through stage 4 chronic kidney disease, or unspecified chronic kidney disease: Secondary | ICD-10-CM | POA: Diagnosis not present

## 2017-02-20 DIAGNOSIS — I69320 Aphasia following cerebral infarction: Secondary | ICD-10-CM | POA: Diagnosis not present

## 2017-02-20 DIAGNOSIS — N183 Chronic kidney disease, stage 3 (moderate): Secondary | ICD-10-CM | POA: Diagnosis not present

## 2017-02-20 DIAGNOSIS — E039 Hypothyroidism, unspecified: Secondary | ICD-10-CM | POA: Diagnosis not present

## 2017-02-20 DIAGNOSIS — M109 Gout, unspecified: Secondary | ICD-10-CM | POA: Diagnosis not present

## 2017-02-20 DIAGNOSIS — I69393 Ataxia following cerebral infarction: Secondary | ICD-10-CM | POA: Diagnosis not present

## 2017-02-20 DIAGNOSIS — M170 Bilateral primary osteoarthritis of knee: Secondary | ICD-10-CM | POA: Diagnosis not present

## 2017-02-20 DIAGNOSIS — Z7982 Long term (current) use of aspirin: Secondary | ICD-10-CM | POA: Diagnosis not present

## 2017-02-20 DIAGNOSIS — I69354 Hemiplegia and hemiparesis following cerebral infarction affecting left non-dominant side: Secondary | ICD-10-CM | POA: Diagnosis not present

## 2017-02-20 DIAGNOSIS — I69392 Facial weakness following cerebral infarction: Secondary | ICD-10-CM | POA: Diagnosis not present

## 2017-02-22 DIAGNOSIS — I639 Cerebral infarction, unspecified: Secondary | ICD-10-CM | POA: Diagnosis not present

## 2017-02-23 DIAGNOSIS — I639 Cerebral infarction, unspecified: Secondary | ICD-10-CM | POA: Diagnosis not present

## 2017-03-19 DIAGNOSIS — M109 Gout, unspecified: Secondary | ICD-10-CM | POA: Diagnosis not present

## 2017-03-19 DIAGNOSIS — M25569 Pain in unspecified knee: Secondary | ICD-10-CM | POA: Diagnosis not present

## 2017-03-19 DIAGNOSIS — M549 Dorsalgia, unspecified: Secondary | ICD-10-CM | POA: Diagnosis not present

## 2017-03-19 DIAGNOSIS — E039 Hypothyroidism, unspecified: Secondary | ICD-10-CM | POA: Diagnosis not present

## 2017-03-25 DIAGNOSIS — I639 Cerebral infarction, unspecified: Secondary | ICD-10-CM | POA: Diagnosis not present

## 2017-04-24 DIAGNOSIS — I639 Cerebral infarction, unspecified: Secondary | ICD-10-CM | POA: Diagnosis not present

## 2017-04-30 DIAGNOSIS — Z79899 Other long term (current) drug therapy: Secondary | ICD-10-CM | POA: Diagnosis not present

## 2017-04-30 DIAGNOSIS — D696 Thrombocytopenia, unspecified: Secondary | ICD-10-CM | POA: Diagnosis not present

## 2017-04-30 DIAGNOSIS — G9340 Encephalopathy, unspecified: Secondary | ICD-10-CM | POA: Diagnosis not present

## 2017-04-30 DIAGNOSIS — G319 Degenerative disease of nervous system, unspecified: Secondary | ICD-10-CM | POA: Diagnosis not present

## 2017-04-30 DIAGNOSIS — I69320 Aphasia following cerebral infarction: Secondary | ICD-10-CM | POA: Diagnosis not present

## 2017-04-30 DIAGNOSIS — I69328 Other speech and language deficits following cerebral infarction: Secondary | ICD-10-CM | POA: Diagnosis not present

## 2017-04-30 DIAGNOSIS — E039 Hypothyroidism, unspecified: Secondary | ICD-10-CM | POA: Diagnosis not present

## 2017-04-30 DIAGNOSIS — I10 Essential (primary) hypertension: Secondary | ICD-10-CM | POA: Diagnosis not present

## 2017-04-30 DIAGNOSIS — N182 Chronic kidney disease, stage 2 (mild): Secondary | ICD-10-CM | POA: Diagnosis not present

## 2017-04-30 DIAGNOSIS — I083 Combined rheumatic disorders of mitral, aortic and tricuspid valves: Secondary | ICD-10-CM | POA: Diagnosis not present

## 2017-04-30 DIAGNOSIS — I69391 Dysphagia following cerebral infarction: Secondary | ICD-10-CM | POA: Diagnosis not present

## 2017-04-30 DIAGNOSIS — I63233 Cerebral infarction due to unspecified occlusion or stenosis of bilateral carotid arteries: Secondary | ICD-10-CM | POA: Diagnosis not present

## 2017-04-30 DIAGNOSIS — Z7982 Long term (current) use of aspirin: Secondary | ICD-10-CM | POA: Diagnosis not present

## 2017-04-30 DIAGNOSIS — I69398 Other sequelae of cerebral infarction: Secondary | ICD-10-CM | POA: Diagnosis not present

## 2017-04-30 DIAGNOSIS — R569 Unspecified convulsions: Secondary | ICD-10-CM | POA: Diagnosis not present

## 2017-04-30 DIAGNOSIS — Z79891 Long term (current) use of opiate analgesic: Secondary | ICD-10-CM | POA: Diagnosis not present

## 2017-04-30 DIAGNOSIS — I638 Other cerebral infarction: Secondary | ICD-10-CM | POA: Diagnosis not present

## 2017-04-30 DIAGNOSIS — I69315 Cognitive social or emotional deficit following cerebral infarction: Secondary | ICD-10-CM | POA: Diagnosis not present

## 2017-04-30 DIAGNOSIS — M109 Gout, unspecified: Secondary | ICD-10-CM | POA: Diagnosis not present

## 2017-04-30 DIAGNOSIS — N179 Acute kidney failure, unspecified: Secondary | ICD-10-CM | POA: Diagnosis not present

## 2017-04-30 DIAGNOSIS — R29810 Facial weakness: Secondary | ICD-10-CM | POA: Diagnosis not present

## 2017-04-30 DIAGNOSIS — I129 Hypertensive chronic kidney disease with stage 1 through stage 4 chronic kidney disease, or unspecified chronic kidney disease: Secondary | ICD-10-CM | POA: Diagnosis not present

## 2017-04-30 DIAGNOSIS — I672 Cerebral atherosclerosis: Secondary | ICD-10-CM | POA: Diagnosis not present

## 2017-04-30 DIAGNOSIS — Z7401 Bed confinement status: Secondary | ICD-10-CM | POA: Diagnosis not present

## 2017-04-30 DIAGNOSIS — R1311 Dysphagia, oral phase: Secondary | ICD-10-CM | POA: Diagnosis not present

## 2017-04-30 DIAGNOSIS — Z880 Allergy status to penicillin: Secondary | ICD-10-CM | POA: Diagnosis not present

## 2017-04-30 DIAGNOSIS — R51 Headache: Secondary | ICD-10-CM | POA: Diagnosis not present

## 2017-04-30 DIAGNOSIS — Z8673 Personal history of transient ischemic attack (TIA), and cerebral infarction without residual deficits: Secondary | ICD-10-CM | POA: Diagnosis not present

## 2017-04-30 DIAGNOSIS — I6613 Occlusion and stenosis of bilateral anterior cerebral arteries: Secondary | ICD-10-CM | POA: Diagnosis not present

## 2017-04-30 DIAGNOSIS — K219 Gastro-esophageal reflux disease without esophagitis: Secondary | ICD-10-CM | POA: Diagnosis not present

## 2017-04-30 DIAGNOSIS — I517 Cardiomegaly: Secondary | ICD-10-CM | POA: Diagnosis not present

## 2017-04-30 DIAGNOSIS — I519 Heart disease, unspecified: Secondary | ICD-10-CM | POA: Diagnosis not present

## 2017-04-30 DIAGNOSIS — R2689 Other abnormalities of gait and mobility: Secondary | ICD-10-CM | POA: Diagnosis not present

## 2017-04-30 DIAGNOSIS — I6501 Occlusion and stenosis of right vertebral artery: Secondary | ICD-10-CM | POA: Diagnosis not present

## 2017-04-30 DIAGNOSIS — Z888 Allergy status to other drugs, medicaments and biological substances status: Secondary | ICD-10-CM | POA: Diagnosis not present

## 2017-04-30 DIAGNOSIS — R531 Weakness: Secondary | ICD-10-CM | POA: Diagnosis not present

## 2017-04-30 DIAGNOSIS — I639 Cerebral infarction, unspecified: Secondary | ICD-10-CM | POA: Diagnosis not present

## 2017-04-30 DIAGNOSIS — M6281 Muscle weakness (generalized): Secondary | ICD-10-CM | POA: Diagnosis not present

## 2017-05-05 DIAGNOSIS — R52 Pain, unspecified: Secondary | ICD-10-CM | POA: Diagnosis not present

## 2017-05-05 DIAGNOSIS — Z7401 Bed confinement status: Secondary | ICD-10-CM | POA: Diagnosis not present

## 2017-05-05 DIAGNOSIS — R1311 Dysphagia, oral phase: Secondary | ICD-10-CM | POA: Diagnosis not present

## 2017-05-05 DIAGNOSIS — R2689 Other abnormalities of gait and mobility: Secondary | ICD-10-CM | POA: Diagnosis not present

## 2017-05-05 DIAGNOSIS — M6281 Muscle weakness (generalized): Secondary | ICD-10-CM | POA: Diagnosis not present

## 2017-05-05 DIAGNOSIS — I69391 Dysphagia following cerebral infarction: Secondary | ICD-10-CM | POA: Diagnosis not present

## 2017-05-05 DIAGNOSIS — R531 Weakness: Secondary | ICD-10-CM | POA: Diagnosis not present

## 2017-05-05 DIAGNOSIS — R569 Unspecified convulsions: Secondary | ICD-10-CM | POA: Diagnosis not present

## 2017-05-05 DIAGNOSIS — I10 Essential (primary) hypertension: Secondary | ICD-10-CM | POA: Diagnosis not present

## 2017-05-05 DIAGNOSIS — Z7982 Long term (current) use of aspirin: Secondary | ICD-10-CM | POA: Diagnosis not present

## 2017-05-05 DIAGNOSIS — N390 Urinary tract infection, site not specified: Secondary | ICD-10-CM | POA: Diagnosis not present

## 2017-05-05 DIAGNOSIS — K219 Gastro-esophageal reflux disease without esophagitis: Secondary | ICD-10-CM | POA: Diagnosis not present

## 2017-05-05 DIAGNOSIS — I69328 Other speech and language deficits following cerebral infarction: Secondary | ICD-10-CM | POA: Diagnosis not present

## 2017-05-05 DIAGNOSIS — E039 Hypothyroidism, unspecified: Secondary | ICD-10-CM | POA: Diagnosis not present

## 2017-05-05 DIAGNOSIS — I69398 Other sequelae of cerebral infarction: Secondary | ICD-10-CM | POA: Diagnosis not present

## 2017-05-05 DIAGNOSIS — I69320 Aphasia following cerebral infarction: Secondary | ICD-10-CM | POA: Diagnosis not present

## 2017-05-05 DIAGNOSIS — I639 Cerebral infarction, unspecified: Secondary | ICD-10-CM | POA: Diagnosis not present

## 2017-05-05 DIAGNOSIS — I69315 Cognitive social or emotional deficit following cerebral infarction: Secondary | ICD-10-CM | POA: Diagnosis not present

## 2017-05-06 DIAGNOSIS — R52 Pain, unspecified: Secondary | ICD-10-CM | POA: Diagnosis not present

## 2017-05-06 DIAGNOSIS — I10 Essential (primary) hypertension: Secondary | ICD-10-CM | POA: Diagnosis not present

## 2017-05-07 DIAGNOSIS — R569 Unspecified convulsions: Secondary | ICD-10-CM | POA: Diagnosis not present

## 2017-05-09 DIAGNOSIS — R531 Weakness: Secondary | ICD-10-CM | POA: Diagnosis not present

## 2017-05-12 DIAGNOSIS — I10 Essential (primary) hypertension: Secondary | ICD-10-CM | POA: Diagnosis not present

## 2017-05-12 DIAGNOSIS — N390 Urinary tract infection, site not specified: Secondary | ICD-10-CM | POA: Diagnosis not present

## 2017-05-20 DIAGNOSIS — N390 Urinary tract infection, site not specified: Secondary | ICD-10-CM | POA: Diagnosis not present

## 2017-05-20 DIAGNOSIS — I10 Essential (primary) hypertension: Secondary | ICD-10-CM | POA: Diagnosis not present

## 2017-05-25 DIAGNOSIS — I639 Cerebral infarction, unspecified: Secondary | ICD-10-CM | POA: Diagnosis not present

## 2017-06-19 DIAGNOSIS — E559 Vitamin D deficiency, unspecified: Secondary | ICD-10-CM | POA: Diagnosis not present

## 2017-06-19 DIAGNOSIS — I639 Cerebral infarction, unspecified: Secondary | ICD-10-CM | POA: Diagnosis not present

## 2017-06-19 DIAGNOSIS — E039 Hypothyroidism, unspecified: Secondary | ICD-10-CM | POA: Diagnosis not present

## 2017-06-19 DIAGNOSIS — R569 Unspecified convulsions: Secondary | ICD-10-CM | POA: Diagnosis not present

## 2017-06-24 ENCOUNTER — Encounter: Payer: Self-pay | Admitting: Neurology

## 2017-06-24 ENCOUNTER — Ambulatory Visit (INDEPENDENT_AMBULATORY_CARE_PROVIDER_SITE_OTHER): Payer: Medicare Other | Admitting: Neurology

## 2017-06-24 VITALS — BP 155/66 | HR 63

## 2017-06-24 DIAGNOSIS — I63311 Cerebral infarction due to thrombosis of right middle cerebral artery: Secondary | ICD-10-CM

## 2017-06-24 DIAGNOSIS — I639 Cerebral infarction, unspecified: Secondary | ICD-10-CM

## 2017-06-24 DIAGNOSIS — E039 Hypothyroidism, unspecified: Secondary | ICD-10-CM

## 2017-06-24 DIAGNOSIS — R269 Unspecified abnormalities of gait and mobility: Secondary | ICD-10-CM | POA: Diagnosis not present

## 2017-06-24 MED ORDER — BUTALBITAL-APAP-CAFFEINE 50-300-40 MG PO CAPS: ORAL_CAPSULE | ORAL | 5 refills | Status: DC

## 2017-06-24 MED ORDER — ZONISAMIDE 100 MG PO CAPS: 100.0000 mg | ORAL_CAPSULE | Freq: Two times a day (BID) | ORAL | 4 refills | Status: AC

## 2017-06-24 MED ORDER — DIVALPROEX SODIUM 500 MG PO DR TAB: DELAYED_RELEASE_TABLET | ORAL | 4 refills | Status: DC

## 2017-06-24 MED ORDER — CLOPIDOGREL BISULFATE 75 MG PO TABS: 75.0000 mg | ORAL_TABLET | Freq: Every day | ORAL | 4 refills | Status: AC

## 2017-06-24 NOTE — Progress Notes (Signed)
PATIENT: Dana Harrington DOB: 1941-03-13  Chief Complaint  Patient presents with  . Seizures    She is here with daughter, Dana Harrington and her son-in-law, Dana Harrington today.  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  Says her last seizure was more than 20 years ago.  She had a stroke in March 2018 and August 2018.  After the second stroke, her seizures started again.  She was taking Depakote 500mg , BID and it was increased 05/05/17 to 500mg  in am and 1000mg  in pm.  She has not had any further activity since her dose adjustment.  Marland Kitchen PCP    Dana Gravel, MD (referred from [facility])     [place] is a 76 year old female, accompanied by her daughter Dana Harrington and son-in-law Dana Harrington, seen in refer by her primary care doctor Dana Harrington, for evaluation of seizure, initial evaluation was on June 24 2017.  She had a history of aortic stenosis, hypertension, hypothyroidism,  Patient has a lifelong history of epilepsy, used to have generalized tonic-clonic seizure, was treated with Depakote 500 mg 2 tablets every night, Zonegran 100 mg twice a day,  She lives independently, but not driving, in March 8264, she suffered stroke, had sudden onset left-sided weakness, gait abnormality, this was treated at [facility].  MRI of the brain showed right basal ganglia lacunar infarction, low old left occipital infarction, atrophy, MRA of the brain was unremarkable, carotid artery ultrasound showed less than 39% stenosis, ejection fraction was 65-70%, A1c was 5.6, she was not on any antiplatelet agent at that time, was put on aspirin 325 mg daily,  After rehabilitation, she was discharged to her daughter's house, April 30 2017, she was noted to have seizure, eyes rolled back, right arm jerking, was admitted to the [facility], this is different from her usual generalized seizure per patient, before the seizure, last a seizure was a few years back, MRI of the brain  showed new stroke at the right parietal lobe.  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  EKG sinus rhythm  echocardiogram ejection 60-65% next graft A1c 5.2, LDL 42 triglyceride 147,  Because of recurrent stroke in March, again in August 2018, upon discharge summary, there was suggestion of 30 days cardiac monitoring.  Review of system: 14 system review was performed, pertinent as above as above   ALLERGIES: Allergies  Allergen Reactions  . Gabapentin Other (See Comments)    "Bouncing"   . Penicillins Rash    No current facility-administered medications for this visit.     PAST MEDICAL HISTORY: Past Medical History:  Diagnosis Date  . Aortic stenosis   . Arthritis    "qwhere" (12/19/2016)  . Chronic lower back  pain   . Complication of anesthesia    "she can't be in a deep anesthesia; she's sleeps long after she's suppose to wake up" (12/19/2016)  . GERD (gastroesophageal reflux disease)   . Gout   . Hypertension   . Hypothyroidism   . LFT elevation   . Paronychia of fourth toe of right foot   . Seizure disorder ([place])    since age 24  . Seizures ([place])    "epileptic; grand mal &  pen; hasn't had one in years; on RX twice/day" (12/19/2016)  . Stroke ([place]) 12/19/2016   left arm and leg weakness, facial droop, slurred speech/notes 12/19/2016  . Stroke ([place])    04/2017  . Vitamin D deficiency   . Zoster 02/2009    PAST SURGICAL HISTORY: Past Surgical History:  Procedure Laterality Date  . LAPAROSCOPIC CHOLECYSTECTOMY  11/2003  . TUBAL LIGATION  1970    FAMILY HISTORY: Family History  Problem Relation Age of Onset  . Heart disease Father   . Heart attack Father        Age 74  . Dementia Mother        Age 69  . Stroke Mother   . Seizures Brother   . Sleep disorder Sister     SOCIAL HISTORY:  Social History   Social History  . Marital status: Widowed    Spouse name: N/A  . Number of children: 4  . Years of education: Did not attend school - even as a child.   Occupational History  . Retired    Social History Main Topics  . Smoking status: Never Smoker  . Smokeless tobacco: Former Systems developer     Comment: "used snuff when super super young"  . Alcohol use No  . Drug use: No  . Sexual activity: Not on file   Other Topics Concern  . Not on file   Social History Narrative   Lives at home with daughter and son-in-law.   Right-handed.   8oz Pepsi per day.     PHYSICAL EXAM   Vitals:   06/24/17 1417  BP: (!) 155/66  Pulse: 63    Not recorded      There is no height or weight on file to calculate BMI.  PHYSICAL EXAMNIATION:  Gen: NAD, conversant, well nourised, obese, well groomed                     Cardiovascular: Regular rate rhythm, no peripheral edema, warm, nontender. Eyes: Conjunctivae clear without exudates or hemorrhage Neck: Supple, no carotid bruits. Pulmonary: Clear to auscultation bilaterally   NEUROLOGICAL EXAM:  MENTAL STATUS: SpeechCognition:  Mild slurred speech, following one-step command, but not three-step commands, rely on her family to provide history   CRANIAL NERVES: CN II: Visual fields are full to  confrontation.   Pupils are round equal and briskly reactive to light. CN III, IV, VI: extraocular movement are normal. No ptosis. CN V: Facial sensation is intact to pinprick in all 3 divisions bilaterally. Corneal responses are intact.  CN VII: Face is symmetric with normal eye closure and smile. CN VIII: Hearing is normal to rubbing fingers CN IX, X: Palate elevates symmetrically. Phonation is normal. CN XI: Head turning and shoulder shrug are intact CN XII: Tongue is midline with normal movements and no atrophy.  MOTOR: Spastic left hemiparesis, decreased spontaneous movement of left arm and left leg, but still has antigravity movement,  REFLEXES: Reflexes are 2+ and symmetric at the biceps,  triceps, knees, and ankles. Plantar responses are flexor.  SENSORY: Intact to light touch, pinprick, positional sensation and vibratory sensation are intact in fingers and toes.  COORDINATION: Rapid alternating movements and fine finger movements are intact. There is no dysmetria on finger-to-nose and heel-knee-shin.    GAIT/STANCE: Needed 2 people assistant to get up from seated position, leaning backwards, difficult to initiate gait,  DIAGNOSTIC DATA (LABS, IMAGING, TESTING) - I reviewed patient records, labs, notes, testing and imaging myself where available.   ASSESSMENT AND PLAN  DERRIANA OSER is a 76 y.o. female   Recurrent stroke involving different vascular territory,  Suggestive of embolic event, referred to cardiac 30 days monitoring  Continue aspirin, Plavix  Long history of epilepsy  Stable on current dose of Depakote DR 500 mg/1000 mg, zonogram 100 twice a day  Marcial Pacas, M.D. Ph.D.  [facility] [address] [place], [place] [address] Ph: [phone] Fax: [phone]  CC: Dana Gravel, MD

## 2017-06-25 DIAGNOSIS — I639 Cerebral infarction, unspecified: Secondary | ICD-10-CM | POA: Diagnosis not present

## 2017-07-13 ENCOUNTER — Observation Stay (HOSPITAL_COMMUNITY): Payer: Medicare Other

## 2017-07-13 ENCOUNTER — Inpatient Hospital Stay (HOSPITAL_COMMUNITY)
Admission: EM | Admit: 2017-07-13 | Discharge: 2017-07-16 | DRG: 065 | Disposition: A | Discharge status: Home Health | Payer: Medicare Other | Attending: Internal Medicine | Admitting: Internal Medicine

## 2017-07-13 ENCOUNTER — Emergency Department (HOSPITAL_COMMUNITY): Payer: Medicare Other

## 2017-07-13 ENCOUNTER — Encounter (HOSPITAL_COMMUNITY): Payer: Self-pay

## 2017-07-13 DIAGNOSIS — G8191 Hemiplegia, unspecified affecting right dominant side: Secondary | ICD-10-CM | POA: Diagnosis not present

## 2017-07-13 DIAGNOSIS — N390 Urinary tract infection, site not specified: Secondary | ICD-10-CM | POA: Diagnosis present

## 2017-07-13 DIAGNOSIS — R1310 Dysphagia, unspecified: Secondary | ICD-10-CM | POA: Diagnosis not present

## 2017-07-13 DIAGNOSIS — Z823 Family history of stroke: Secondary | ICD-10-CM | POA: Diagnosis not present

## 2017-07-13 DIAGNOSIS — Z7902 Long term (current) use of antithrombotics/antiplatelets: Secondary | ICD-10-CM

## 2017-07-13 DIAGNOSIS — I69354 Hemiplegia and hemiparesis following cerebral infarction affecting left non-dominant side: Secondary | ICD-10-CM

## 2017-07-13 DIAGNOSIS — E785 Hyperlipidemia, unspecified: Secondary | ICD-10-CM | POA: Diagnosis present

## 2017-07-13 DIAGNOSIS — I082 Rheumatic disorders of both aortic and tricuspid valves: Secondary | ICD-10-CM | POA: Diagnosis present

## 2017-07-13 DIAGNOSIS — G40909 Epilepsy, unspecified, not intractable, without status epilepticus: Secondary | ICD-10-CM

## 2017-07-13 DIAGNOSIS — R29810 Facial weakness: Secondary | ICD-10-CM | POA: Diagnosis present

## 2017-07-13 DIAGNOSIS — E039 Hypothyroidism, unspecified: Secondary | ICD-10-CM | POA: Diagnosis present

## 2017-07-13 DIAGNOSIS — Z8249 Family history of ischemic heart disease and other diseases of the circulatory system: Secondary | ICD-10-CM

## 2017-07-13 DIAGNOSIS — Z79899 Other long term (current) drug therapy: Secondary | ICD-10-CM

## 2017-07-13 DIAGNOSIS — Z7982 Long term (current) use of aspirin: Secondary | ICD-10-CM

## 2017-07-13 DIAGNOSIS — K219 Gastro-esophageal reflux disease without esophagitis: Secondary | ICD-10-CM | POA: Diagnosis present

## 2017-07-13 DIAGNOSIS — I69320 Aphasia following cerebral infarction: Secondary | ICD-10-CM | POA: Diagnosis not present

## 2017-07-13 DIAGNOSIS — I071 Rheumatic tricuspid insufficiency: Secondary | ICD-10-CM | POA: Diagnosis present

## 2017-07-13 DIAGNOSIS — I10 Essential (primary) hypertension: Secondary | ICD-10-CM | POA: Diagnosis present

## 2017-07-13 DIAGNOSIS — R29715 NIHSS score 15: Secondary | ICD-10-CM | POA: Diagnosis not present

## 2017-07-13 DIAGNOSIS — I129 Hypertensive chronic kidney disease with stage 1 through stage 4 chronic kidney disease, or unspecified chronic kidney disease: Secondary | ICD-10-CM | POA: Diagnosis not present

## 2017-07-13 DIAGNOSIS — D696 Thrombocytopenia, unspecified: Secondary | ICD-10-CM | POA: Diagnosis not present

## 2017-07-13 DIAGNOSIS — R2990 Unspecified symptoms and signs involving the nervous system: Secondary | ICD-10-CM | POA: Diagnosis present

## 2017-07-13 DIAGNOSIS — I272 Pulmonary hypertension, unspecified: Secondary | ICD-10-CM | POA: Diagnosis present

## 2017-07-13 DIAGNOSIS — I5030 Unspecified diastolic (congestive) heart failure: Secondary | ICD-10-CM | POA: Diagnosis not present

## 2017-07-13 DIAGNOSIS — K1120 Sialoadenitis, unspecified: Secondary | ICD-10-CM | POA: Diagnosis present

## 2017-07-13 DIAGNOSIS — I5189 Other ill-defined heart diseases: Secondary | ICD-10-CM | POA: Diagnosis present

## 2017-07-13 DIAGNOSIS — G40409 Other generalized epilepsy and epileptic syndromes, not intractable, without status epilepticus: Secondary | ICD-10-CM | POA: Diagnosis present

## 2017-07-13 DIAGNOSIS — K119 Disease of salivary gland, unspecified: Secondary | ICD-10-CM | POA: Diagnosis not present

## 2017-07-13 DIAGNOSIS — N183 Chronic kidney disease, stage 3 unspecified: Secondary | ICD-10-CM | POA: Diagnosis present

## 2017-07-13 DIAGNOSIS — Z993 Dependence on wheelchair: Secondary | ICD-10-CM | POA: Diagnosis not present

## 2017-07-13 DIAGNOSIS — I639 Cerebral infarction, unspecified: Principal | ICD-10-CM | POA: Diagnosis present

## 2017-07-13 DIAGNOSIS — I69992 Facial weakness following unspecified cerebrovascular disease: Secondary | ICD-10-CM | POA: Diagnosis not present

## 2017-07-13 DIAGNOSIS — Z8673 Personal history of transient ischemic attack (TIA), and cerebral infarction without residual deficits: Secondary | ICD-10-CM

## 2017-07-13 DIAGNOSIS — I6789 Other cerebrovascular disease: Secondary | ICD-10-CM | POA: Diagnosis not present

## 2017-07-13 DIAGNOSIS — K118 Other diseases of salivary glands: Secondary | ICD-10-CM | POA: Diagnosis present

## 2017-07-13 DIAGNOSIS — R4781 Slurred speech: Secondary | ICD-10-CM | POA: Diagnosis not present

## 2017-07-13 DIAGNOSIS — I517 Cardiomegaly: Secondary | ICD-10-CM | POA: Diagnosis not present

## 2017-07-13 LAB — RAPID URINE DRUG SCREEN, HOSP PERFORMED
AMPHETAMINES: NOT DETECTED
BENZODIAZEPINES: POSITIVE — AB
Barbiturates: POSITIVE — AB
Cocaine: NOT DETECTED
OPIATES: POSITIVE — AB
Tetrahydrocannabinol: NOT DETECTED

## 2017-07-13 LAB — I-STAT CHEM 8, ED
BUN: 29 mg/dL — ABNORMAL HIGH (ref 6–20)
CHLORIDE: 110 mmol/L (ref 101–111)
Calcium, Ion: 1.31 mmol/L (ref 1.15–1.40)
Creatinine, Ser: 1.1 mg/dL — ABNORMAL HIGH (ref 0.44–1.00)
Glucose, Bld: 82 mg/dL (ref 65–99)
HCT: 44 % (ref 36.0–46.0)
Hemoglobin: 15 g/dL (ref 12.0–15.0)
POTASSIUM: 4 mmol/L (ref 3.5–5.1)
SODIUM: 143 mmol/L (ref 135–145)
TCO2: 23 mmol/L (ref 22–32)

## 2017-07-13 LAB — CBC
HEMATOCRIT: 44.4 % (ref 36.0–46.0)
Hemoglobin: 14.1 g/dL (ref 12.0–15.0)
MCH: 30.2 pg (ref 26.0–34.0)
MCHC: 31.8 g/dL (ref 30.0–36.0)
MCV: 95.1 fL (ref 78.0–100.0)
PLATELETS: 110 10*3/uL — AB (ref 150–400)
RBC: 4.67 MIL/uL (ref 3.87–5.11)
RDW: 15.1 % (ref 11.5–15.5)
WBC: 9 10*3/uL (ref 4.0–10.5)

## 2017-07-13 LAB — I-STAT TROPONIN, ED: Troponin i, poc: 0 ng/mL (ref 0.00–0.08)

## 2017-07-13 LAB — URINALYSIS, ROUTINE W REFLEX MICROSCOPIC
BACTERIA UA: NONE SEEN
Bilirubin Urine: NEGATIVE
Glucose, UA: NEGATIVE mg/dL
Hgb urine dipstick: NEGATIVE
KETONES UR: NEGATIVE mg/dL
Nitrite: NEGATIVE
PROTEIN: NEGATIVE mg/dL
Specific Gravity, Urine: 1.028 (ref 1.005–1.030)
TRANS EPITHEL UA: 3
pH: 5 (ref 5.0–8.0)

## 2017-07-13 LAB — DIFFERENTIAL
BASOS PCT: 0 %
Basophils Absolute: 0 10*3/uL (ref 0.0–0.1)
EOS PCT: 6 %
Eosinophils Absolute: 0.6 10*3/uL (ref 0.0–0.7)
LYMPHS PCT: 33 %
Lymphs Abs: 2.9 10*3/uL (ref 0.7–4.0)
MONO ABS: 0.9 10*3/uL (ref 0.1–1.0)
Monocytes Relative: 10 %
NEUTROS ABS: 4.5 10*3/uL (ref 1.7–7.7)
Neutrophils Relative %: 50 %

## 2017-07-13 LAB — COMPREHENSIVE METABOLIC PANEL
ALBUMIN: 2.7 g/dL — AB (ref 3.5–5.0)
ALK PHOS: 65 U/L (ref 38–126)
ALT: 12 U/L — ABNORMAL LOW (ref 14–54)
AST: 27 U/L (ref 15–41)
Anion gap: 5 (ref 5–15)
BUN: 27 mg/dL — ABNORMAL HIGH (ref 6–20)
CHLORIDE: 110 mmol/L (ref 101–111)
CO2: 24 mmol/L (ref 22–32)
Calcium: 9.3 mg/dL (ref 8.9–10.3)
Creatinine, Ser: 1.17 mg/dL — ABNORMAL HIGH (ref 0.44–1.00)
GFR calc non Af Amer: 44 mL/min — ABNORMAL LOW (ref >60)
GFR, EST AFRICAN AMERICAN: 51 mL/min — AB (ref >60)
GLUCOSE: 90 mg/dL (ref 65–99)
Potassium: 4 mmol/L (ref 3.5–5.1)
SODIUM: 139 mmol/L (ref 135–145)
Total Bilirubin: 0.8 mg/dL (ref 0.3–1.2)
Total Protein: 7 g/dL (ref 6.5–8.1)

## 2017-07-13 LAB — PROTIME-INR
INR: 1.04
PROTHROMBIN TIME: 13.5 s (ref 11.4–15.2)

## 2017-07-13 LAB — APTT: APTT: 25 s (ref 24–36)

## 2017-07-13 LAB — ETHANOL

## 2017-07-13 LAB — VALPROIC ACID LEVEL: VALPROIC ACID LVL: 38 ug/mL — AB (ref 50.0–100.0)

## 2017-07-13 MED ORDER — STROKE: EARLY STAGES OF RECOVERY BOOK
Freq: Once | Status: AC
  Administered 2017-07-13: 21:00:00
  Filled 2017-07-13: qty 1

## 2017-07-13 MED ORDER — ACETAMINOPHEN 160 MG/5ML PO SOLN: 650.0000 mg | ORAL | Status: DC | PRN

## 2017-07-13 MED ORDER — SODIUM CHLORIDE 0.9 % IV SOLN
INTRAVENOUS | Status: DC
Start: 2017-07-13 — End: 2017-07-15
  Administered 2017-07-13 – 2017-07-14 (×3): via INTRAVENOUS

## 2017-07-13 MED ORDER — ONDANSETRON HCL 4 MG/2ML IJ SOLN: 4.0000 mg | Freq: Four times a day (QID) | INTRAMUSCULAR | Status: DC | PRN

## 2017-07-13 MED ORDER — LEVOTHYROXINE SODIUM 100 MCG IV SOLR
50.0000 ug | Freq: Every day | INTRAVENOUS | Status: DC
  Administered 2017-07-13: 50 ug via INTRAVENOUS
  Filled 2017-07-13: qty 5

## 2017-07-13 MED ORDER — VALPROATE SODIUM 500 MG/5ML IV SOLN
1000.0000 mg | Freq: Every evening | INTRAVENOUS | Status: DC
  Administered 2017-07-13 – 2017-07-14 (×2): 1000 mg via INTRAVENOUS
  Filled 2017-07-13 (×2): qty 10

## 2017-07-13 MED ORDER — LEVOTHYROXINE SODIUM 100 MCG IV SOLR: 50.0000 ug | Freq: Every day | INTRAVENOUS | Status: DC

## 2017-07-13 MED ORDER — PNEUMOCOCCAL VAC POLYVALENT 25 MCG/0.5ML IJ INJ
0.5000 mL | INJECTION | INTRAMUSCULAR | Status: DC
  Filled 2017-07-13: qty 0.5

## 2017-07-13 MED ORDER — DEXTROSE 5 % IV SOLN: 500.0000 mg | Freq: Every evening | INTRAVENOUS | Status: DC

## 2017-07-13 MED ORDER — VALPROATE SODIUM 500 MG/5ML IV SOLN: 1000.0000 mg | Freq: Every evening | INTRAVENOUS | Status: DC

## 2017-07-13 MED ORDER — ACETAMINOPHEN 650 MG RE SUPP
650.0000 mg | RECTAL | Status: DC | PRN
  Filled 2017-07-13: qty 1

## 2017-07-13 MED ORDER — VALPROATE SODIUM 500 MG/5ML IV SOLN
500.0000 mg | Freq: Every morning | INTRAVENOUS | Status: DC
  Administered 2017-07-14: 500 mg via INTRAVENOUS
  Filled 2017-07-13 (×3): qty 5

## 2017-07-13 MED ORDER — DEXTROSE 5 % IV SOLN
1.0000 g | INTRAVENOUS | Status: AC
  Administered 2017-07-13 – 2017-07-15 (×3): 1 g via INTRAVENOUS
  Filled 2017-07-13 (×3): qty 10

## 2017-07-13 MED ORDER — ACETAMINOPHEN 325 MG PO TABS: 650.0000 mg | ORAL_TABLET | ORAL | Status: DC | PRN

## 2017-07-13 MED ORDER — MORPHINE SULFATE (PF) 4 MG/ML IV SOLN
4.0000 mg | Freq: Once | INTRAVENOUS | Status: AC
  Administered 2017-07-13: 4 mg via INTRAVENOUS
  Filled 2017-07-13: qty 1

## 2017-07-13 MED ORDER — GADOBENATE DIMEGLUMINE 529 MG/ML IV SOLN
10.0000 mL | Freq: Once | INTRAVENOUS | Status: AC | PRN
  Administered 2017-07-13: 8 mL via INTRAVENOUS

## 2017-07-13 MED ORDER — ONDANSETRON HCL 4 MG/2ML IJ SOLN
4.0000 mg | Freq: Once | INTRAMUSCULAR | Status: AC
  Administered 2017-07-13: 4 mg via INTRAVENOUS
  Filled 2017-07-13: qty 2

## 2017-07-13 MED ORDER — METOPROLOL TARTRATE 5 MG/5ML IV SOLN: 5.0000 mg | Freq: Four times a day (QID) | INTRAVENOUS | Status: DC

## 2017-07-13 MED ORDER — ENOXAPARIN SODIUM 40 MG/0.4ML ~~LOC~~ SOLN
40.0000 mg | SUBCUTANEOUS | Status: DC
  Administered 2017-07-13 – 2017-07-15 (×3): 40 mg via SUBCUTANEOUS
  Filled 2017-07-13 (×3): qty 0.4

## 2017-07-13 NOTE — Evaluation (Signed)
Clinical/Bedside Swallow Evaluation Patient Details  Name: Dana Harrington MRN: [id number] Date of Birth: July 04, 1941  Today's Date: 07/13/2017 Time: SLP Start Time (ACUTE ONLY): 1655 SLP Stop Time (ACUTE ONLY): 1715 SLP Time Calculation (min) (ACUTE ONLY): 20 min  Past Medical History:  Past Medical History:  Diagnosis Date  . Aortic stenosis   . Arthritis    "qwhere" (12/19/2016)  . Chronic lower back pain   . Complication of anesthesia    "she can't be in a deep anesthesia; she's sleeps long after she's suppose to wake up" (12/19/2016)  . GERD (gastroesophageal reflux disease)   . Gout   . Hypertension   . Hypothyroidism   . LFT elevation   . Paronychia of fourth toe of right foot   . Seizure disorder ([place])    since age 38  . 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 Stroke ([place]) 12/19/2016   left arm and leg weakness, facial droop, slurred speech/notes 12/19/2016  . Stroke ([place])    04/2017  . Vitamin D deficiency   . Zoster 02/2009   Past Surgical History:  Past Surgical History:  Procedure Laterality Date  . LAPAROSCOPIC CHOLECYSTECTOMY  11/2003  . 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 Per daughter, patient has had intermittent episodes of recurrent drooling since her last stroke; after rehabilitation patient has been inconsistent with performing rehabilitative therapies including oral pharyngeal  exercises recommended by SLP. CT finding earlier today corresponds to an acute to subacute mostly white matter infarct in the posterior right MCA territory. MRI showed stable and indeterminate but probably benign 2.8 cm mass in the left parotid space.   Assessment / Plan / Recommendation Clinical Impression  Patient presents with severe risk for aspiration in the setting of new CVA, poor secretion management with prior history of CVA with left sided weakness, dysphagia. Per pt's daughter, she was eating soft foods with thin liquids, though with poor intake recently, worked with SLP in SN[facility] and [facility]. Pt is alert, confused but following basic commands with extended time, occasional cues needed. Very minimal verbal output, though she does introduce herself to SLP. She is initially unable to elicit a volitional swallow or with teaspoons of ice chips, and no reflexive swallows observed despite excessive pooling of saliva, intermittent drooling from R and L. SLP assisted pt with self-feeding small cup sip of thin liquid; after approximately 5 seconds holding pt did initiate a pharyngeal swallow, as well as one subsequent dry swallow with max cues. At this time recommend she remain NPO pending reassessment next date to determine readiness for PO, likely instrumental assessment needed given pt's history. Pt's daughter and son present and are in agreement with this plan. RN informed and recommended oral suction be made available given pt's poor management of secretions and risk for aspiration. Will follow up next date for readiness for MBS, cognitive-linguistic evaluation.  SLP Visit Diagnosis: Dysphagia, oropharyngeal phase (R13.12)  Aspiration Risk  Severe aspiration risk    Diet Recommendation NPO        Other  Recommendations Oral Care Recommendations: Oral care QID Other Recommendations: Remove water pitcher;Have oral suction available   Follow up Recommendations Other (comment) (TBD)      Frequency  and Duration min 2x/week  2 weeks       Prognosis Prognosis for Safe Diet Advancement: Good Barriers to Reach Goals: Cognitive deficits      Swallow Study   General Date of Onset: 07/13/17 HPI: Dana Harrington a 76 y.o.femalewith medical history significant for initial diagnosis of epilepsy at 41 months of age and had been without seizures/quiescent until she had two episodes of seizures both in March 2018 and again in August 2018 in the context of acute CVA, initial non-embolic CVA March 2409 with associated left-sided weakness. 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 Per daughter, patient has had intermittent episodes of recurrent drooling since her last stroke; after rehabilitation patient has been inconsistent with performing rehabilitative therapies including oral pharyngeal exercises recommended by SLP. CT finding earlier today corresponds to an acute to subacute mostly white matter infarct in the posterior right MCA territory. MRI showed stable and indeterminate but probably benign 2.8 cm mass in the left parotid space. Type of Study: Bedside Swallow Evaluation Previous Swallow Assessment: Bedside swallow 12/19/2016 after initial CVA recommended Dysphagia 2 thin liquid Diet Prior to this Study: NPO Temperature Spikes Noted: No Respiratory Status: Room air History of Recent Intubation: No Behavior/Cognition: Alert;Cooperative;Confused Oral Cavity Assessment: Excessive secretions Oral Care Completed by SLP: Yes Oral Cavity - Dentition: Edentulous Vision: Functional for self-feeding Self-Feeding Abilities: Needs assist Patient Positioning: Upright in bed Baseline Vocal Quality: Low vocal intensity Volitional Cough: Cognitively unable to elicit Volitional Swallow: Able to elicit    Oral/Motor/Sensory Function Overall Oral Motor/Sensory  Function: Moderate impairment Facial Symmetry: Within Functional Limits Facial Strength: Reduced left Lingual ROM: Reduced right;Suspected CN XII (hypoglossal) dysfunction Lingual Symmetry: Suspected CN XII (hypoglossal) dysfunction;Other (Comment) (deviates left, consistent with left sided weakness) Lingual Strength: Reduced   Ice Chips Ice chips: Impaired Presentation: Spoon Oral Phase Impairments: Reduced lingual movement/coordination;Reduced labial seal Oral Phase Functional Implications: Oral holding;Right anterior spillage;Left anterior spillage Pharyngeal Phase Impairments: Unable to trigger swallow   Thin Liquid Thin Liquid: Impaired Presentation: Cup Oral Phase Impairments: Reduced labial seal;Reduced lingual movement/coordination Oral Phase Functional Implications: Right anterior spillage;Left anterior spillage;Oral holding;Prolonged oral transit Pharyngeal  Phase Impairments: Suspected delayed Swallow    Nectar Thick Nectar Thick Liquid: Not tested   Honey Thick Honey Thick Liquid: Not tested   Puree Puree: Not tested   Solid   GO   Solid: Not tested    Functional Assessment Tool Used: skilled clinical judgment Functional Limitations: Swallowing Swallow Current Status (B3532): 100 percent impaired, limited or restricted Swallow Goal Status (D9242): At least 40 percent but less than 60 percent impaired, limited or restricted   Aliene Altes 07/13/2017,5:48 PM  Deneise Lever, [place], [place] Speech-Language Pathologist [phone]

## 2017-07-13 NOTE — Progress Notes (Signed)
.........  NURSING PROGRESS NOTE  Dana Harrington [id number] Admission Data: 07/13/2017 5:30 PM Attending Provider: Kinnie Feil, MD PCP:Kim, Jeneen Rinks, MD Code Status: full  Allergies:  Gabapentin and Penicillins Past Medical History:   has a past medical history of Aortic stenosis; Arthritis; Chronic lower back pain; Complication of anesthesia; GERD (gastroesophageal reflux disease); Gout; Hypertension; Hypothyroidism; LFT elevation; Paronychia of fourth toe of right foot; Seizure disorder ([place]); Seizures ([place]); Stroke [facility]) (12/19/2016); Stroke [facility]); Vitamin D deficiency; and Zoster (02/2009). Past Surgical History:   has a past surgical history that includes Laparoscopic cholecystectomy (11/2003) and Tubal ligation (1970). Social History:   reports that she has never smoked. She has quit using smokeless tobacco. She reports that she does not drink alcohol or use drugs.  Dana Harrington is a 76 y.o. female patient admitted from ED:   Last Documented Vital Signs: Blood pressure (!) 130/57, pulse 67, temperature 98.1 F (36.7 C), temperature source Oral, resp. rate 20, height 5\' 2"  (1.575 m), weight 73.2 kg (161 lb 6 oz), SpO2 99 %.  Cardiac Monitoring: Box # 9 in place. Cardiac monitor yields:normal sinus rhythm.  IV Fluids:  IV in place, occlusive dsg intact without redness, IV cath d left, condition patent and no redness normal saline.   Skin: foam dressing on sacrum  Patient/Family orientated to room. Information packet given to patient/family. Admission inpatient armband information verified with patient/family to include name and date of birth and placed on patient arm. Side rails up x 2, fall assessment and education completed with patient/family. Patient/family able to verbalize understanding of risk associated with falls and verbalized understanding to call for assistance before getting out of bed. Call light within reach. Patient/family able to voice and demonstrate understanding  of unit orientation instructions.    Will continue to evaluate and treat per MD orders.   Talbert Forest RN

## 2017-07-13 NOTE — ED Notes (Signed)
Placed PureWick w/ assist from [facility] [employer], [employer])

## 2017-07-13 NOTE — ED Triage Notes (Signed)
Patient arrived from [facility] following slurred speech with facial droop and right sided weakness. Has had recent stroke with ledt sided deficits. Family reports patient has ben total care x 1 month. Patient alert on arrival

## 2017-07-13 NOTE — ED Notes (Signed)
Placed allyven pad on pt's sacrum- stage 2 bed ulcer. Also, placed pure wick on pt and cleaned pt

## 2017-07-13 NOTE — Progress Notes (Signed)
  Speech Language Pathology Cancellation    Patient Details Name: Dana Harrington MRN: [id number] DOB: October 11, 1940 Today's Date: 07/13/2017 Time:  -       BSE was attempted and the patient was off the floor for MRI.  ST will continue efforts to complete evaluation.     Shelly Flatten, MA, [facility] SLP [phone] Lamar Sprinkles 07/13/2017, 2:04 PM

## 2017-07-13 NOTE — H&P (Signed)
History and Physical    Dana Harrington:[id number] DOB: [birth year] DOA: 07/13/2017   PCP: Jani Gravel, MD   Attending physician: Daleen Bo  Patient coming from/Resides with: Private residence  Chief Complaint: Slurred speech, facial drooping and right-sided weakness  HPI: Dana Harrington is a 76 y.o. 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 (discharged on aspirin). Recurrent stroke August 2018 and was admitted to [facility] in [place] with her presenting symptom being seizure activity. Stroke at that time was located in the right parietal lobe. He was discharged on aspirin with Plavix. Other medical history includes stage III chronic kidney disease diastolic dysfunction in the context of pulmonary hypertension and tricuspid regurgitation, hypertension, hypothyroidism and aortic stenosis.   Patient was in her usual state of health until early this morning. Occasionally she requires Robaxin for muscle spasms and awakened at 3 AM to take a dose of Robaxin. She went back to bed and was awakened at 9 AM by her son who noticed she had slurred speech and appeared to be aphasic having difficulty speaking and forming words. She was also weak on her right side with right facial drooping. She was brought to the ER for further evaluation and treatment. Noncontrasted CT of the head performed in the ER revealed new focal low density area seen in the right periventricular white matter concerning for infarction of indeterminate age.. Neurology consulted and will see the patient formally. MRI of the brain recommended and patient has been taken to MR before could be examined by the internal  medicine team. Of note previous strokes have only been detected on MRI. The patient finally returned from MR around 2:15 PM  In my discussion with the daughter and the patient the patient has had intermittent episodes of recurrent drooling since her last stroke. Daughter reports that after rehabilitation patient has been inconsistent with performing rehabilitative therapies including oral pharyngeal exercises recommended by SLP. Patient has chronic poor oral intake despite utilization of protein supplementation and appetite stimulants. Daughter reports urine has been concentrated in the past week. No fevers or chills. No other upper respiratory infection symptoms or gastrointestinal infection symptoms. Recent poor oral intake although does better when fed. Daughter reports that has declined physically since discharge from rehabilitation in August.  ED Course:  Vital Signs: BP (!) 138/55   Pulse 72   Temp 97.7 F (36.5 C)   Resp 17   SpO2 99%  CT of the contrast: As above MR brain: In process Lab data: Sodium 139, potassium 4.0, chloride 110, CO2 24, glucose 90, BUN 27, creatinine 1.17, anion gap 5, albumin 2.7, LFTs not elevated, poc troponin 0.00, white count 9000 with normal differential, hemoglobin 14.1, platelets 110,000, urinalysis and culture ordered but pending collection Medications and treatments: Morphine 4 mg IV 1, Zofran 4 mg IV 1  Review of Systems:  In addition to the HPI above,  No Fever-chills, myalgias or other constitutional symptoms No Headache, changes with Vision or hearing, new weakness, tingling, numbness in any extremity, dizziness, no change in chronic gait disturbance or imbalance secondary to known residual mild left hemiplegia and chronic bilateral knee pain, no tremors or witnessed typical seizure activity for this patient No problems  swallowing food or Liquids, indigestion/reflux, choking or coughing while eating, abdominal pain with or after eating No Chest pain,  Cough or Shortness of Breath, palpitations, orthopnea or DOE No Abdominal pain, N/V, melena,hematochezia, dark tarry stools, constipation No dysuria, hematuria or flank pain No new skin rashes, lesions, masses or bruises, No new joint pains, aches, swelling or redness No recent unintentional weight gain or but ? If weight loss No polyuria, polydypsia or polyphagia   Past Medical History:  Diagnosis Date  . Aortic stenosis   . Arthritis    "qwhere" (12/19/2016)  . Chronic lower back pain   . Complication of anesthesia    "she can't be in a deep anesthesia; she's sleeps long after she's suppose to wake up" (12/19/2016)  . GERD (gastroesophageal reflux disease)   . Gout   . Hypertension   . Hypothyroidism   . LFT elevation   . Paronychia of fourth toe of right foot   . Seizure disorder ([place])    since age 29  . Seizures ([place])    "epileptic; grand mal & pen; hasn't had one in years; on RX twice/day" (12/19/2016)  . Stroke ([place]) 12/19/2016   left arm and leg weakness, facial droop, slurred speech/notes 12/19/2016  . Stroke ([place])    04/2017  . Vitamin D deficiency   . Zoster 02/2009    Past Surgical History:  Procedure Laterality Date  . LAPAROSCOPIC CHOLECYSTECTOMY  11/2003  . TUBAL LIGATION  1970    Social History   Social History  . Marital status: Widowed    Spouse name: N/A  . Number of children: 4  . Years of education: Did not attend school - even as a child.   Occupational History  . Retired    Social History Main Topics  . Smoking status: Never Smoker  . Smokeless tobacco: Former Systems developer     Comment: "used snuff when super super young"  . Alcohol use No  . Drug use: No  . Sexual activity: Not on file   Other Topics Concern  . Not on file   Social History Narrative   Lives at home with daughter and son-in-law.   Right-handed.   8oz Pepsi per day.    Mobility: Rolling walker but inability to ambulate secondary to chronic bilateral knee pain Work history:  Not obtained   Allergies  Allergen Reactions  . Gabapentin Other (See Comments)    "Bouncing"   . Penicillins Rash    Family History  Problem Relation Age of Onset  . Heart disease Father   . Heart attack Father        Age 15  . Dementia Mother        Age 64  . Stroke Mother   . Seizures Brother   . Sleep disorder Sister      Prior to Admission medications   Medication Sig Start Date End Date Taking? Authorizing Provider  acetaminophen (TYLENOL) 325 MG tablet Take 325 mg by mouth every 6 (six) hours as needed for pain.    [provider]  aspirin EC 325 MG tablet Take 1 tablet (325 mg total) by mouth daily. 12/22/16   Hongalgi, Lenis Dickinson, MD  Butalbital-APAP-Caffeine (FIORICET) 50-300-40 MG CAPS As needed for headache 06/24/17   Marcial Pacas, MD  carvedilol (COREG) 12.5 MG tablet Take 12.5 mg by mouth 2 (two) times daily with a meal.    [provider]  clopidogrel (PLAVIX) 75 MG tablet Take 1 tablet (75 mg total) by  mouth daily. 06/24/17   Marcial Pacas, MD  divalproex (DEPAKOTE) 500 MG DR tablet Taking 500mg  in am and 1000mg  in pm. 06/24/17   Marcial Pacas, MD  febuxostat (ULORIC) 40 MG tablet Take 40 mg by mouth daily.    [provider]  levothyroxine (SYNTHROID, LEVOTHROID) 100 MCG tablet Take 100 mcg by mouth daily before breakfast.    [provider]  loratadine-pseudoephedrine (CLARITIN-D 24-HOUR) 10-240 MG 24 hr tablet Take 1 tablet by mouth daily.    [provider]  methocarbamol (ROBAXIN) 500 MG tablet Take 500 mg by mouth every 6 (six) hours as needed for muscle spasms. 12/02/16   [provider]  omeprazole (PRILOSEC) 40 MG capsule Take 1 capsule by mouth 2 (two) times daily.  07/11/15   [provider]  oxyCODONE-acetaminophen (PERCOCET/ROXICET) 5-325 MG tablet TK 1 T PO Q 6 H PRN 06/23/17   [provider]  pravastatin (PRAVACHOL) 40 MG tablet Take 40 mg by mouth daily.    [provider]    sucralfate (CARAFATE) 1 G tablet Take 1 tablet by mouth 2 (two) times daily. 07/11/15   [provider]  Vitamin D, Ergocalciferol, (DRISDOL) 50000 units CAPS capsule Take 50,000 Units by mouth once a week. 12/13/16   [provider]  zonisamide (ZONEGRAN) 100 MG capsule Take 1 capsule (100 mg total) by mouth 2 (two) times daily. 06/24/17   Marcial Pacas, MD    Physical Exam: Vitals:   07/13/17 1033 07/13/17 1058 07/13/17 1115 07/13/17 1245  BP: (!) 173/58   (!) 138/55  Pulse: 77  64 72  Resp: 16  20 17   Temp: 97.7 F (36.5 C) 97.7 F (36.5 C)    TempSrc: Oral     SpO2: 99%  100% 99%      Constitutional: NAD, calm, comfortable Eyes: PERRL, lids and conjunctivae normal ENMT: Mucous membranes are Dry. Posterior pharynx clear of any exudate or lesions. Poor dentition.  Neck: normal, supple, no thyromegaly Respiratory: clear to auscultation bilaterally, no wheezing, no crackles. Normal respiratory effort. No accessory muscle use.  Cardiovascular: Regular rate and rhythm, no murmurs / rubs / gallops. No extremity edema. 2+ pedal pulses. No carotid bruits.  Abdomen: no tenderness, no masses palpated. No hepatosplenomegaly. Bowel sounds positive.  Musculoskeletal: no clubbing / cyanosis. No joint deformity upper and lower extremities. Good ROM, no contractures. Normal muscle tone.  Skin: no rashes, lesions, ulcers. No induration Neurologic: CN 2-12 grossly intact and no visible drooling upon my evaluation. Sensation intact, DTR normal. Strength diminished throughout with mild residual left hemiplegia arm greater than leg, lower extremity strength on right 3/5, right upper extremity strength 4/5 Psychiatric: Normal judgment and insight. Alert and oriented x 3. Flat mood.    Labs on Admission: I have personally reviewed following labs and imaging studies  CBC:  Recent Labs Lab 07/13/17 1117 07/13/17 1133  WBC 9.0  --   NEUTROABS 4.5  --   HGB 14.1 15.0  HCT 44.4 44.0   MCV 95.1  --   PLT 110*  --    Basic Metabolic Panel:  Recent Labs Lab 07/13/17 1117 07/13/17 1133  NA 139 143  K 4.0 4.0  CL 110 110  CO2 24  --   GLUCOSE 90 82  BUN 27* 29*  CREATININE 1.17* 1.10*  CALCIUM 9.3  --    GFR: CrCl cannot be calculated (Unknown ideal weight.). Liver Function Tests:  Recent Labs Lab 07/13/17 1117  AST 27  ALT  12*  ALKPHOS 65  BILITOT 0.8  PROT 7.0  ALBUMIN 2.7*   No results for input(s): LIPASE, AMYLASE in the last 168 hours. No results for input(s): AMMONIA in the last 168 hours. Coagulation Profile:  Recent Labs Lab 07/13/17 1117  INR 1.04   Cardiac Enzymes: No results for input(s): CKTOTAL, CKMB, CKMBINDEX, TROPONINI in the last 168 hours. BNP (last 3 results) No results for input(s): PROBNP in the last 8760 hours. HbA1C: No results for input(s): HGBA1C in the last 72 hours. CBG: No results for input(s): GLUCAP in the last 168 hours. Lipid Profile: No results for input(s): CHOL, HDL, LDLCALC, TRIG, CHOLHDL, LDLDIRECT in the last 72 hours. Thyroid Function Tests: No results for input(s): TSH, T4TOTAL, FREET4, T3FREE, THYROIDAB in the last 72 hours. Anemia Panel: No results for input(s): VITAMINB12, FOLATE, FERRITIN, TIBC, IRON, RETICCTPCT in the last 72 hours. Urine analysis:    Component Value Date/Time   COLORURINE YELLOW 12/19/2016 1229   APPEARANCEUR CLEAR 12/19/2016 1229   LABSPEC 1.019 12/19/2016 1229   PHURINE 5.0 12/19/2016 1229   GLUCOSEU NEGATIVE 12/19/2016 1229   HGBUR SMALL (A) 12/19/2016 1229   BILIRUBINUR NEGATIVE 12/19/2016 1229   KETONESUR NEGATIVE 12/19/2016 1229   PROTEINUR NEGATIVE 12/19/2016 1229   UROBILINOGEN 0.2 07/18/2015 0108   NITRITE NEGATIVE 12/19/2016 1229   LEUKOCYTESUR NEGATIVE 12/19/2016 1229   Sepsis Labs: @LABRCNTIP (procalcitonin:4,lacticidven:4) )No results found for this or any previous visit (from the past 240 hour(s)).   Radiological Exams on Admission: Ct Head Wo  Contrast  Result Date: 07/13/2017 CLINICAL DATA:  Slurred speech, facial droop. EXAM: CT HEAD WITHOUT CONTRAST TECHNIQUE: Contiguous axial images were obtained from the base of the skull through the vertex without intravenous contrast. COMPARISON:  CT scan of December 19, 2016. FINDINGS: Brain: Mild diffuse cortical atrophy is noted. No mass effect or midline shift is noted. Ventricular size is within normal limits. There is no evidence of mass lesion or hemorrhage. New focal low density is noted in right periventricular white matter concerning for infarction of indeterminate age. Vascular: No hyperdense vessel or unexpected calcification. Skull: Normal. Negative for fracture or focal lesion. Sinuses/Orbits: No acute finding. Other: None. IMPRESSION: Mild diffuse cortical atrophy. New focal low density seen in right periventricular white matter concerning for infarction of indeterminate age; MRI is recommended for further evaluation. Electronically Signed   By: Marijo Conception, M.D.   On: 07/13/2017 12:03    EKG: (Independently reviewed) Sinus rhythm with ventricular rate 66 bpm noting some difficulty in visualizing P waves given interference-type artifact in the majority of the leads with P waves best visualize in lead 2 noting they are biphasic, QTC 408 ms, peaked T waves in V2 as well as other signs of early repolarization in inferior leads  Assessment/Plan Principal Problem:   Stroke-like symptoms/ History of CVA (cerebrovascular accident) -Patient presents with altered mentation, slurred speech with likely associated aphasia and right side weakness, right facial drooping  with known previous strokes in March and August 2018 -Typically has concurrent seizure activity with stroke onset - check EEG (see below) -Neurology consulted-patient with recurrent stroke despite dual antiplatelet therapy so consideration being given to changing to Aggrenox -Patient with difficulty controlling oral  secretions/drooling so will remain NPO until evaluated by SLP-into IV fluid hydration in the interim -PT/OT/SOP evaluations -HgbA1c was 5.6 in March -Panel in March: HDL 31, LDL 57, triglycerides 108, cholesterol 110-on Pravachol prior to admission -Underwent echocardiogram in March therefore no utility in repeating -Carotid duplex  in March therefore no utility in repeating -Frequent neurological checks -NPO so holding preadmission aspirin and Plavix -Allow for permissive hypertension in presumed acute stroke -Other causes to strokelike symptoms could be secondary to recent Robaxin and/or undetected UTI  **MR brain:acute to subacute mostly white matter infarct in the posterior right MCA territory.  Active Problems:   Hypertension -Current blood pressure well controlled but allowing for permissive hypertension therefore holding preadmission carvedilol    Seizure disorder  -History of seizures onset age 75 months and had been quiescent for multiple years until had stroke in March with seizure activity noted; stroke in August presented initially with witnessed seizure activity -Followed by Dr. Debarah Crape in the outpatient setting -Continue Depakote but utilize IV route while NPO   Abnormal UA -appears c/w UTI but could be from [facility] as well -empiric Rocephin (h/o rash to PCN but cephalosporins typically OK to use w/ minimal craoss reaction) -no Cipro 2/2 seizure disorder -FU on urne cx    Diastolic dysfunction 2/2 Pulmonary HTN w/Tricuspid regurgitation -Currently asymptomatic without any upper respiratory symptomatology    CKD (chronic kidney disease), stage III  -Renal function stable at baseline    Hypothyroidism, adult -IV Synthroid -TSH 1.245 in March    Thrombocytopenia  -Chronic recurrent problem with platelets currently greater than 100,000   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 ANP-BC [facility] Pager [phone]   If 7PM-7AM, please contact night-coverage [email or web address] Password [email or web address]  07/13/2017, 1:09 PM

## 2017-07-13 NOTE — ED Provider Notes (Signed)
[facility] DEPT Provider Note   CSN: [id number] Arrival date & time: 07/13/17  1030     History   Chief Complaint Chief Complaint  Patient presents with  . Cerebrovascular Accident    HPI Dana Harrington is a 76 y.o. female.  Pt presents to the ED today with possible stroke.  She is a 76 yo female who lives with her daughter and son-in-law.  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  She is followed by Dr. Krista Blue (neurology) and is on asa and plavix.  The pt was her normal self at 0300.  She woke up her daughter around 0300 because of muscle spasms.  Her daughter said she swallowed her robaxin without problems.  The daughter went to work this morning and son-in-law called her around 0900 saying that she was not able to swallow and was not moving her right side very well.  The pt can normally talk and is unable to speak well now.        Past Medical History:  Diagnosis Date  . Aortic stenosis   . Arthritis    "qwhere" (12/19/2016)  . Chronic lower back pain   . Complication of anesthesia    "she can't be in a deep anesthesia; she's sleeps long after she's suppose to wake up" (12/19/2016)  . GERD (gastroesophageal reflux disease)   . Gout   . Hypertension   . Hypothyroidism   . LFT elevation   . Paronychia of fourth toe of right foot   . Seizure disorder ([place])    since age 52  . 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 Stroke ([place]) 12/19/2016   left arm and leg weakness, facial droop, slurred speech/notes 12/19/2016  . Stroke ([place])    04/2017  . Vitamin D deficiency   . Zoster 02/2009    Patient Active Problem List   Diagnosis Date Noted  . Gait abnormality 06/24/2017  . Left-sided weakness   . Stroke (cerebrum) ([place]) 12/19/2016  . Ischemic stroke ([place]) 12/19/2016  . Essential hypertension 12/19/2016  . Hypothyroidism 12/19/2016  . Musculoskeletal pain 12/19/2016  . GERD  (gastroesophageal reflux disease) 12/19/2016  . Seizure ([place]) 12/19/2016  . Gout 12/19/2016    Past Surgical History:  Procedure Laterality Date  . LAPAROSCOPIC CHOLECYSTECTOMY  11/2003  . TUBAL LIGATION  1970    OB History    No data available       Home Medications    Prior to Admission medications   Medication Sig Start Date End Date Taking? Authorizing Provider  acetaminophen (TYLENOL) 325 MG tablet Take 325 mg by mouth every 6 (six) hours as needed for pain.    [provider]  aspirin EC 325 MG tablet Take 1 tablet (325 mg total) by mouth daily. 12/22/16   Hongalgi, Lenis Dickinson, MD  Butalbital-APAP-Caffeine (FIORICET) 50-300-40 MG CAPS As needed for headache 06/24/17   Marcial Pacas, MD  carvedilol (COREG) 12.5 MG tablet Take 12.5 mg by mouth 2 (two) times daily with a meal.    [provider]  clopidogrel (PLAVIX) 75 MG tablet Take 1 tablet (75 mg total) by mouth daily. 06/24/17   Marcial Pacas, MD  divalproex (DEPAKOTE) 500 MG DR tablet Taking 500mg  in am and 1000mg  in pm. 06/24/17   Marcial Pacas, MD  febuxostat (ULORIC) 40 MG tablet Take 40 mg by mouth daily.    [provider]  levothyroxine (SYNTHROID, LEVOTHROID) 100 MCG tablet Take 100 mcg by mouth daily before breakfast.    [provider]  loratadine-pseudoephedrine (CLARITIN-D 24-HOUR) 10-240 MG 24 hr tablet Take 1 tablet by mouth daily.    [provider]  methocarbamol (ROBAXIN) 500 MG tablet Take 500 mg by mouth every 6 (six) hours as needed for muscle spasms. 12/02/16   [provider]  omeprazole (PRILOSEC) 40 MG capsule Take 1 capsule by mouth 2 (two) times daily.  07/11/15   [provider]  oxyCODONE-acetaminophen (PERCOCET/ROXICET) 5-325 MG tablet TK 1 T PO Q 6 H PRN 06/23/17   [provider]  pravastatin (PRAVACHOL) 40 MG tablet Take 40 mg by mouth daily.    [provider]  sucralfate (CARAFATE) 1 G tablet Take 1 tablet by mouth 2 (two) times  daily. 07/11/15   [provider]  Vitamin D, Ergocalciferol, (DRISDOL) 50000 units CAPS capsule Take 50,000 Units by mouth once a week. 12/13/16   [provider]  zonisamide (ZONEGRAN) 100 MG capsule Take 1 capsule (100 mg total) by mouth 2 (two) times daily. 06/24/17   Marcial Pacas, MD    Family History Family History  Problem Relation Age of Onset  . Heart disease Father   . Heart attack Father        Age 82  . Dementia Mother        Age 52  . Stroke Mother   . Seizures Brother   . Sleep disorder Sister     Social History Social History  Substance Use Topics  . Smoking status: Never Smoker  . Smokeless tobacco: Former Systems developer     Comment: "used snuff when super super young"  . Alcohol use No     Allergies   Gabapentin and Penicillins   Review of Systems Review of Systems  Neurological:       Left sided weakness (chronic) Right sided weakness (new) Right sided facial droop.  All other systems reviewed and are negative.    Physical Exam Updated Vital Signs BP (!) 173/58   Pulse 64   Temp 97.7 F (36.5 C)   Resp 20   SpO2 100%   Physical Exam  Constitutional: She appears well-developed and well-nourished.  HENT:  Head: Normocephalic and atraumatic.  Right Ear: External ear normal.  Left Ear: External ear normal.  Nose: Nose normal.  Mouth/Throat: Oropharynx is clear and moist.  Eyes: Pupils are equal, round, and reactive to light. Conjunctivae and EOM are normal.  Neck: Normal range of motion. Neck supple.  Cardiovascular: Normal rate, regular rhythm, normal heart sounds and intact distal pulses.   Pulmonary/Chest: Effort normal and breath sounds normal.  Abdominal: Soft. Bowel sounds are normal.  Musculoskeletal: Normal range of motion.  Neurological: She is alert.  New right sided weakness and facial droop. Old left sided weakness. Dysarthria.  Skin: Skin is warm.  Psychiatric: Judgment normal. Her mood appears anxious.  Nursing  note and vitals reviewed.    ED Treatments / Results  Labs (all labs ordered are listed, but only abnormal results are displayed) Labs Reviewed  CBC - Abnormal; Notable for the following:       Result Value   Platelets 110 (*)    All other components within normal limits  COMPREHENSIVE METABOLIC PANEL - Abnormal; Notable for the following:    BUN 27 (*)    Creatinine, Ser 1.17 (*)    Albumin 2.7 (*)    ALT 12 (*)  GFR calc non Af Amer 44 (*)    GFR calc Af Amer 51 (*)    All other components within normal limits  VALPROIC ACID LEVEL - Abnormal; Notable for the following:    Valproic Acid Lvl 38 (*)    All other components within normal limits  I-STAT CHEM 8, ED - Abnormal; Notable for the following:    BUN 29 (*)    Creatinine, Ser 1.10 (*)    All other components within normal limits  URINE CULTURE  ETHANOL  PROTIME-INR  APTT  DIFFERENTIAL  RAPID URINE DRUG SCREEN, HOSP PERFORMED  URINALYSIS, ROUTINE W REFLEX MICROSCOPIC  I-STAT TROPONIN, ED    EKG  EKG Interpretation  Date/Time:  Sunday July 13 2017 10:42:56 EDT Ventricular Rate:  66 PR Interval:    QRS Duration: 91 QT Interval:  389 QTC Calculation: 408 R Axis:   3 Text Interpretation:  Junctional rhythm LVH with secondary repolarization abnormality Confirmed by Isla Pence [phone]) on 07/13/2017 11:05:34 AM       Radiology Ct Head Wo Contrast  Result Date: 07/13/2017 CLINICAL DATA:  Slurred speech, facial droop. EXAM: CT HEAD WITHOUT CONTRAST TECHNIQUE: Contiguous axial images were obtained from the base of the skull through the vertex without intravenous contrast. COMPARISON:  CT scan of December 19, 2016. FINDINGS: Brain: Mild diffuse cortical atrophy is noted. No mass effect or midline shift is noted. Ventricular size is within normal limits. There is no evidence of mass lesion or hemorrhage. New focal low density is noted in right periventricular white matter concerning for infarction of  indeterminate age. Vascular: No hyperdense vessel or unexpected calcification. Skull: Normal. Negative for fracture or focal lesion. Sinuses/Orbits: No acute finding. Other: None. IMPRESSION: Mild diffuse cortical atrophy. New focal low density seen in right periventricular white matter concerning for infarction of indeterminate age; MRI is recommended for further evaluation. Electronically Signed   By: Marijo Conception, M.D.   On: 07/13/2017 12:03    Procedures Procedures (including critical care time)  Medications Ordered in ED Medications  morphine 4 MG/ML injection 4 mg (not administered)  ondansetron (ZOFRAN) injection 4 mg (not administered)  0.9 %  sodium chloride infusion (not administered)  levothyroxine (SYNTHROID, LEVOTHROID) injection 50 mcg (not administered)     Initial Impression / Assessment and Plan / ED Course  I have reviewed the triage vital signs and the nursing notes.  Pertinent labs & imaging results that were available during my care of the patient were reviewed by me and considered in my medical decision making (see chart for details).   Pt d/w Dr. Orlena Sheldon (neurology) who recommends MRI.  He will think about what medications to change pt to as she is on plavix and asa already.    Pt d/w hospitalists for admission.  Final Clinical Impressions(s) / ED Diagnoses   Final diagnoses:  Acute CVA (cerebrovascular accident) [facility])    New Prescriptions New Prescriptions   No medications on file     Isla Pence, MD 07/13/17 1248

## 2017-07-13 NOTE — ED Notes (Signed)
Pt transported to MRI 

## 2017-07-14 ENCOUNTER — Observation Stay (HOSPITAL_COMMUNITY): Payer: Medicare Other

## 2017-07-14 DIAGNOSIS — I071 Rheumatic tricuspid insufficiency: Secondary | ICD-10-CM | POA: Diagnosis present

## 2017-07-14 DIAGNOSIS — Z7902 Long term (current) use of antithrombotics/antiplatelets: Secondary | ICD-10-CM | POA: Diagnosis not present

## 2017-07-14 DIAGNOSIS — Z7982 Long term (current) use of aspirin: Secondary | ICD-10-CM | POA: Diagnosis not present

## 2017-07-14 DIAGNOSIS — K119 Disease of salivary gland, unspecified: Secondary | ICD-10-CM | POA: Diagnosis not present

## 2017-07-14 DIAGNOSIS — G8190 Hemiplegia, unspecified affecting unspecified side: Secondary | ICD-10-CM | POA: Diagnosis not present

## 2017-07-14 DIAGNOSIS — E039 Hypothyroidism, unspecified: Secondary | ICD-10-CM | POA: Diagnosis not present

## 2017-07-14 DIAGNOSIS — R1310 Dysphagia, unspecified: Secondary | ICD-10-CM | POA: Diagnosis present

## 2017-07-14 DIAGNOSIS — R29715 NIHSS score 15: Secondary | ICD-10-CM | POA: Diagnosis present

## 2017-07-14 DIAGNOSIS — Z79899 Other long term (current) drug therapy: Secondary | ICD-10-CM | POA: Diagnosis not present

## 2017-07-14 DIAGNOSIS — R2990 Unspecified symptoms and signs involving the nervous system: Secondary | ICD-10-CM | POA: Diagnosis not present

## 2017-07-14 DIAGNOSIS — N183 Chronic kidney disease, stage 3 (moderate): Secondary | ICD-10-CM

## 2017-07-14 DIAGNOSIS — I5030 Unspecified diastolic (congestive) heart failure: Secondary | ICD-10-CM | POA: Diagnosis not present

## 2017-07-14 DIAGNOSIS — R29810 Facial weakness: Secondary | ICD-10-CM | POA: Diagnosis present

## 2017-07-14 DIAGNOSIS — G40409 Other generalized epilepsy and epileptic syndromes, not intractable, without status epilepticus: Secondary | ICD-10-CM | POA: Diagnosis present

## 2017-07-14 DIAGNOSIS — K219 Gastro-esophageal reflux disease without esophagitis: Secondary | ICD-10-CM | POA: Diagnosis present

## 2017-07-14 DIAGNOSIS — I69354 Hemiplegia and hemiparesis following cerebral infarction affecting left non-dominant side: Secondary | ICD-10-CM | POA: Diagnosis not present

## 2017-07-14 DIAGNOSIS — I082 Rheumatic disorders of both aortic and tricuspid valves: Secondary | ICD-10-CM | POA: Diagnosis present

## 2017-07-14 DIAGNOSIS — G464 Cerebellar stroke syndrome: Secondary | ICD-10-CM | POA: Diagnosis not present

## 2017-07-14 DIAGNOSIS — D696 Thrombocytopenia, unspecified: Secondary | ICD-10-CM | POA: Diagnosis present

## 2017-07-14 DIAGNOSIS — I272 Pulmonary hypertension, unspecified: Secondary | ICD-10-CM | POA: Diagnosis present

## 2017-07-14 DIAGNOSIS — Z8249 Family history of ischemic heart disease and other diseases of the circulatory system: Secondary | ICD-10-CM | POA: Diagnosis not present

## 2017-07-14 DIAGNOSIS — Z823 Family history of stroke: Secondary | ICD-10-CM | POA: Diagnosis not present

## 2017-07-14 DIAGNOSIS — N390 Urinary tract infection, site not specified: Secondary | ICD-10-CM | POA: Diagnosis present

## 2017-07-14 DIAGNOSIS — I639 Cerebral infarction, unspecified: Secondary | ICD-10-CM | POA: Diagnosis present

## 2017-07-14 DIAGNOSIS — I10 Essential (primary) hypertension: Secondary | ICD-10-CM | POA: Diagnosis not present

## 2017-07-14 DIAGNOSIS — E785 Hyperlipidemia, unspecified: Secondary | ICD-10-CM | POA: Diagnosis present

## 2017-07-14 DIAGNOSIS — G8191 Hemiplegia, unspecified affecting right dominant side: Secondary | ICD-10-CM | POA: Diagnosis present

## 2017-07-14 DIAGNOSIS — G40909 Epilepsy, unspecified, not intractable, without status epilepticus: Secondary | ICD-10-CM | POA: Diagnosis not present

## 2017-07-14 DIAGNOSIS — I129 Hypertensive chronic kidney disease with stage 1 through stage 4 chronic kidney disease, or unspecified chronic kidney disease: Secondary | ICD-10-CM | POA: Diagnosis present

## 2017-07-14 DIAGNOSIS — Z993 Dependence on wheelchair: Secondary | ICD-10-CM | POA: Diagnosis not present

## 2017-07-14 DIAGNOSIS — I69320 Aphasia following cerebral infarction: Secondary | ICD-10-CM | POA: Diagnosis not present

## 2017-07-14 LAB — LIPID PANEL
Cholesterol: 135 mg/dL (ref 0–200)
HDL: 28 mg/dL — AB (ref >40)
LDL CALC: 66 mg/dL (ref 0–99)
Total CHOL/HDL Ratio: 4.8 RATIO
Triglycerides: 207 mg/dL — ABNORMAL HIGH (ref <150)
VLDL: 41 mg/dL — AB (ref 0–40)

## 2017-07-14 LAB — URINE CULTURE

## 2017-07-14 LAB — HEMOGLOBIN A1C
HEMOGLOBIN A1C: 5.2 % (ref 4.8–5.6)
Mean Plasma Glucose: 102.54 mg/dL

## 2017-07-14 MED ORDER — OXYCODONE-ACETAMINOPHEN 5-325 MG PO TABS
1.0000 | ORAL_TABLET | Freq: Four times a day (QID) | ORAL | Status: DC | PRN
  Administered 2017-07-14 – 2017-07-15 (×2): 1 via ORAL
  Filled 2017-07-14 (×3): qty 1

## 2017-07-14 MED ORDER — LEVOTHYROXINE SODIUM 100 MCG PO TABS
100.0000 ug | ORAL_TABLET | Freq: Every day | ORAL | Status: DC
Start: 2017-07-15 — End: 2017-07-16
  Administered 2017-07-15 – 2017-07-16 (×2): 100 ug via ORAL
  Filled 2017-07-14 (×2): qty 1

## 2017-07-14 MED ORDER — CLOPIDOGREL BISULFATE 75 MG PO TABS
75.0000 mg | ORAL_TABLET | Freq: Every day | ORAL | Status: DC
  Administered 2017-07-14 – 2017-07-16 (×3): 75 mg via ORAL
  Filled 2017-07-14 (×3): qty 1

## 2017-07-14 MED ORDER — ALPRAZOLAM 0.5 MG PO TABS
0.5000 mg | ORAL_TABLET | Freq: Every day | ORAL | Status: DC | PRN
  Administered 2017-07-15: 0.5 mg via ORAL
  Filled 2017-07-14: qty 1

## 2017-07-14 MED ORDER — MORPHINE SULFATE (PF) 2 MG/ML IV SOLN
1.0000 mg | INTRAVENOUS | Status: DC | PRN
  Administered 2017-07-14 (×2): 1 mg via INTRAVENOUS
  Filled 2017-07-14 (×2): qty 1

## 2017-07-14 MED ORDER — ASPIRIN EC 325 MG PO TBEC
325.0000 mg | DELAYED_RELEASE_TABLET | Freq: Every day | ORAL | Status: DC
  Administered 2017-07-14 – 2017-07-16 (×3): 325 mg via ORAL
  Filled 2017-07-14 (×3): qty 1

## 2017-07-14 MED ORDER — STROKE: EARLY STAGES OF RECOVERY BOOK
Freq: Once | Status: DC
  Filled 2017-07-14: qty 1

## 2017-07-14 NOTE — Progress Notes (Signed)
  Speech Language Pathology Treatment: Dysphagia  Patient Details Name: Dana Harrington MRN: [id number] DOB: 06/10/1941 Today's Date: 07/14/2017 Time: 7001-7494 SLP Time Calculation (min) (ACUTE ONLY): 15 min  Assessment / Plan / Recommendation Clinical Impression  Pt observed with PO trials; no overt s/sx of aspiration noted across all consistencies. Pt has subjectively improved since BSE on 10/14; demonstrates ability to manage secretions, elicit volitional swallow, no oral holding noted and only minimal anterior spillage noticed with thin liquids 2/2 RT facial droop. Chest xray shows no PNA and per daughter report, pt's swallow fx is at baseline. Pt is edentulous and refuses to wear dentures. Recommend Dys 2 (fine chopped) diet with thin liquid and meds crushed in puree which is baseline diet. Will f/u for diet check.     HPI HPI: [name] Triplettis a 76 y.o.femalewith medical history significant for initial diagnosis of epilepsy at 77 months of age and had been without seizures/quiescent until she had two episodes of seizures both in March 2018 and again in August 2018 in the context of acute CVA, initial non-embolic CVA March 4967 with associated left-sided weakness. 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 Per daughter, patient has had intermittent episodes of recurrent drooling since her last stroke; after rehabilitation patient has been inconsistent with performing rehabilitative therapies including oral pharyngeal exercises recommended by SLP. Present admit for slurred speech and difficulty speaking. RT side weakness and RT facial drooping. CT finding 10/14 corresponds to an acute to subacute mostly white matter infarct in the posterior right MCA territory. MRI showed stable and indeterminate but probably benign 2.8 cm mass in the left parotid  space. Chest xray shows no PNA.      SLP Plan  Continue with current plan of care       Recommendations  Diet recommendations: Dysphagia 2 (fine chop);Thin liquid Liquids provided via: Cup;Straw Medication Administration: Crushed with puree Supervision: Full supervision/cueing for compensatory strategies Compensations: Small sips/bites;Monitor for anterior loss Postural Changes and/or Swallow Maneuvers: Seated upright 90 degrees                Oral Care Recommendations: Oral care BID Follow up Recommendations: None SLP Visit Diagnosis: Dysphagia, oral phase (R13.11) Plan: Continue with current plan of care       GO                Aaron Edelman, Student SLP 07/14/2017, 10:21 AM

## 2017-07-14 NOTE — Progress Notes (Signed)
Patient complaints of burning sensation in lower legs. Cool to touch but among touching patient says burns. Dr. Eliseo Squires paged with complaint

## 2017-07-14 NOTE — Procedures (Signed)
ELECTROENCEPHALOGRAM REPORT  Date of Study: 07/14/2017  Patient's Name: Dana Harrington MRN: [id number] Date of Birth: 04/29/1941  Referring Provider: Erin Hearing, NP  Clinical History: This is a 76 year old woman with epilepsy admitted with aphasia.  Medications: valproate (DEPACON) 1,000 mg in dextrose 5 % 50 mL IVPB  acetaminophen (TYLENOL) tablet 650 mg  ALPRAZolam (XANAX) tablet 0.5 mg  aspirin EC tablet 325 mg  cefTRIAXone (ROCEPHIN) 1 g in dextrose 5 % 50 mL IVPB  clopidogrel (PLAVIX) tablet 75 mg  enoxaparin (LOVENOX) injection 40 mg  levothyroxine (SYNTHROID, LEVOTHROID) tablet 100 mcg  morphine 2 MG/ML injection 1 mg  ondansetron (ZOFRAN) injection 4 mg  oxyCODONE-acetaminophen (PERCOCET/ROXICET) 5-325 MG per tablet 1 tablet  pneumococcal 23 valent vaccine (PNU-IMMUNE) injection 0.5 mL   Technical Summary: A multichannel digital EEG recording measured by the international 10-20 system with electrodes applied with paste and impedances below 5000 ohms performed as portable with EKG monitoring in an awake and asleep patient.  Hyperventilation wa not performed. Photic stimulation was performed.  The digital EEG was referentially recorded, reformatted, and digitally filtered in a variety of bipolar and referential montages for optimal display.   Description: The patient is awake and asleep during the recording. There is no clear posterior dominant rhythm. The background consists of a moderate amount of diffuse 4-5 Hz theta and 2-3 Hz delta slowing, at times with triphasic waves seen. During drowsiness and sleep, there is an increase in theta and delta slowing with rare vertex waves seen.  Photic stimulation did not elicit any abnormalities.  There were rare generalized high voltage spike and wave discharges with frontal predominance seen. There were occasional left frontocentral sharp waves seen. There were no electrographic seizures seen.    EKG lead was  unremarkable.  Impression: This awake and asleep EEG is abnormal due to the presence of: 1. Mild to moderate diffuse background slowing with occasional triphasic waves 2. Occasional left frontocentral epileptiform discharges 3. Rare generalized spike and wave discharges with frontal predominance  Clinical Correlation of the above findings indicates diffuse cerebral dysfunction that is non-specific in etiology and can be seen with hypoxic/ischemic injury, toxic/metabolic encephalopathies, neurodegenerative disorders, or medication effect. Triphasic waves are typically seen with hepatic encephalopathy but can be seen with other metabolic encephalopathies as well. 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 Independent epileptiform discharges from the left frontocentral region indicate a possible tendency for seizures to arise from this region. Clinical correlation is advised.   Ellouise Newer, M.D.

## 2017-07-14 NOTE — Evaluation (Signed)
Speech Language Pathology Evaluation Patient Details Name: Dana Harrington MRN: [id number] DOB: [birth year] Today's Date: 07/14/2017 Time: 6295-2841 SLP Time Calculation (min) (ACUTE ONLY): 19 min  Problem List:  Patient Active Problem List   Diagnosis Date Noted  . Stroke-like symptoms 07/13/2017  . History of CVA (cerebrovascular accident) 07/13/2017  . Hypertension 07/13/2017  . Seizure disorder ([place]) 07/13/2017  . CKD (chronic kidney disease), stage III ([place]) 07/13/2017  . Hypothyroidism, adult 07/13/2017  . Diastolic dysfunction 32/44/0102  . Pulmonary HTN ([place]) 07/13/2017  . Tricuspid regurgitation 07/13/2017  . Thrombocytopenia ([place]) 07/13/2017  . left Parotid mass 07/13/2017  . Gait abnormality 06/24/2017  . Left-sided weakness   . Stroke (cerebrum) ([place]) 12/19/2016  . Ischemic stroke ([place]) 12/19/2016  . Essential hypertension 12/19/2016  . Hypothyroidism 12/19/2016  . Musculoskeletal pain 12/19/2016  . GERD (gastroesophageal reflux disease) 12/19/2016  . Seizure ([place]) 12/19/2016  . Gout 12/19/2016   Past Medical History:  Past Medical History:  Diagnosis Date  . Aortic stenosis   . Arthritis    "qwhere" (12/19/2016)  . Chronic lower back pain   . Complication of anesthesia    "she can't be in a deep anesthesia; she's sleeps long after she's suppose to wake up" (12/19/2016)  . GERD (gastroesophageal reflux disease)   . Gout   . Hypertension   . Hypothyroidism   . LFT elevation   . Paronychia of fourth toe of right foot   . Seizure disorder ([place])    since age 53  . 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 Stroke ([place]) 12/19/2016   left arm and leg weakness, facial droop, slurred speech/notes 12/19/2016  . Stroke ([place])    04/2017  . Vitamin D deficiency   . Zoster 02/2009   Past Surgical History:  Past Surgical History:  Procedure Laterality Date  . LAPAROSCOPIC CHOLECYSTECTOMY  11/2003  .  TUBAL LIGATION  1970   HPI:  Dana Harrington a 76 y.o.femalewith medical history significant for initial diagnosis of epilepsy at 16 months of age and had been without seizures/quiescent until she had two episodes of seizures both in March 2018 and again in August 2018 in the context of acute CVA, initial non-embolic CVA March 7253 with associated left-sided weakness. 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 Per daughter, patient has had intermittent episodes of recurrent drooling since her last stroke; after rehabilitation patient has been inconsistent with performing rehabilitative therapies including oral pharyngeal exercises recommended by SLP. Present admit for slurred speech and difficulty speaking. RT side weakness and RT facial drooping. CT finding 10/14 corresponds to an acute to subacute mostly white matter infarct in the posterior right MCA territory. MRI showed stable and indeterminate but probably benign 2.8 cm mass in the left parotid space. Chest xray shows no PNA.   Assessment / Plan / Recommendation Clinical Impression  Pt presents with speech and language WFL and mild cognitive deficits in the areas of short term memory, retrieval of information and processing speed; Mild concern for verbal expression initiation. Discussed areas of concern with daughter who reports that deficits are at baseline following previous stroke. No concern for receptive/expressive language noted; responded to commands, answered yes/no questions and completed convergent naming tasks with 100% accuracy. Pt has adequate care post-discharge; no SLP f/u needed at this  time. Will sign off.     SLP Assessment  SLP Recommendation/Assessment: Patient does not need any further Speech Lanaguage Pathology Services SLP Visit Diagnosis: Frontal lobe and executive function  deficit Frontal lobe and executive function deficit following: Cerebral infarction    Follow Up Recommendations  None    Frequency and Duration           SLP Evaluation Cognition  Overall Cognitive Status: History of cognitive impairments - at baseline Arousal/Alertness: Awake/alert Orientation Level: Oriented to person;Oriented to place Attention: Sustained Sustained Attention: Appears intact Memory: Impaired Memory Impairment: Decreased short term memory;Retrieval deficit Decreased Short Term Memory: Verbal complex       Comprehension  Auditory Comprehension Overall Auditory Comprehension: Appears within functional limits for tasks assessed Interfering Components: Processing speed EffectiveTechniques: Extra processing time    Expression Expression Primary Mode of Expression: Verbal Verbal Expression Overall Verbal Expression: Impaired at baseline Initiation: Impaired Level of Generative/Spontaneous Verbalization: Word Repetition: No impairment Naming: No impairment Pragmatics: No impairment Written Expression Dominant Hand: Right   Oral / Motor  Oral Motor/Sensory Function Overall Oral Motor/Sensory Function: Moderate impairment Facial Symmetry: Within Functional Limits Lingual ROM: Reduced right;Suspected CN XII (hypoglossal) dysfunction Lingual Symmetry: Suspected CN XII (hypoglossal) dysfunction;Other (Comment) Motor Speech Overall Motor Speech: Appears within functional limits for tasks assessed   [place], Student SLP 07/14/2017, 10:42 AM

## 2017-07-14 NOTE — Evaluation (Signed)
Physical Therapy Evaluation Patient Details Name: Dana Harrington MRN: [id number] DOB: 22-Apr-1941 Today's Date: 07/14/2017   History of Present Illness    76 y.o. 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  Clinical Impression  Orders received for PT evaluation. Patient demonstrates deficits in functional mobility as indicated below. Will benefit from continued skilled PT to address deficits and maximize function. Will see as indicated and progress as tolerated.  At this time, daughter present at beside to provide history. Patient very limited. Will trial PT services.    Follow Up Recommendations SNF;Supervision/Assistance - 24 hour    Equipment Recommendations  None recommended by PT    Recommendations for Other Services       Precautions / Restrictions Precautions Precautions: Fall Restrictions Weight Bearing Restrictions: No      Mobility  Bed Mobility Overal bed mobility: Needs Assistance Bed Mobility: Supine to Sit     Supine to sit: +2 for physical assistance;HOB elevated;Total assist     General bed mobility comments: Total A to transition hips towards EOB with use of bed pad. Max A to move BLEs and elevate trunk  Transfers Overall transfer level: Needs assistance               General transfer comment: lateral transfer with use of sheet to slide pt from bed to drop arm recliner with Total A +2 to facilitate movement of BLEs and trunk.   Ambulation/Gait                Stairs            Wheelchair Mobility    Modified Rankin (Stroke Patients Only)       Balance                                              Pertinent Vitals/Pain Pain Assessment: Faces Faces Pain Scale: Hurts even more Pain Location: Bil Knees Pain Descriptors / Indicators: Constant;Discomfort;Grimacing;Guarding;Moaning Pain Intervention(s): Monitored during session;Limited activity within patient's tolerance;Repositioned    Home Living Family/patient expects to be discharged to:: Private residence Living Arrangements: Children Available Help at Discharge: Family;Available 24 hours/day Type of Home: House Home Access: Stairs to enter       Home Equipment: Grab bars - tub/shower;Bedside commode;Shower seat - built in;Hand held Tourist information centre manager - 2 wheels;Cane - single point;Cane - quad (Adjustable bed)      Prior Function Level of Independence: Needs assistance   Gait / Transfers Assistance Needed: Use RW  ADL's / Homemaking Assistance Needed: Daughter assist with all ADLs and recent has been feeding pt        Hand Dominance   Dominant Hand: Right    Extremity/Trunk Assessment   Upper Extremity Assessment Upper Extremity Assessment: Generalized weakness;LUE deficits/detail LUE Deficits / Details: Generalized weakness with significant weakness in LUE LUE Coordination: decreased fine motor;decreased gross motor    Lower Extremity Assessment Lower Extremity Assessment: Generalized weakness;RLE deficits/detail;LLE deficits/detail;Difficult to assess due to impaired cognition RLE Deficits / Details: Noted weakness 2/5 gross  motions, significant rigid tone noted (worse on the left) RLE: Unable to fully assess due to pain RLE Sensation: decreased light touch RLE Coordination: decreased fine motor;decreased gross motor LLE Deficits / Details: LLE noted assymetrical weakness compared to RLE. Limited motor activation very Rigid tone bilaterally L >R  LLE: Unable to fully assess due to pain LLE Sensation: decreased light touch LLE Coordination:  decreased fine motor;decreased gross motor    Cervical / Trunk Assessment Cervical / Trunk Assessment: Kyphotic;Other exceptions Cervical / Trunk Exceptions: Lower back pain  Communication   Communication: Expressive difficulties  Cognition Arousal/Alertness: Awake/alert Behavior During Therapy: Anxious Overall Cognitive Status: History of cognitive impairments - at baseline                                 General Comments: Pt anxious to move and required cues throughout to increase communication and follow commands      General Comments General comments (skin integrity, edema, etc.): Pt daughter present throughout session    Exercises     Assessment/Plan    PT Assessment Patient needs continued PT services  PT Problem List Decreased strength;Decreased activity tolerance;Decreased balance;Decreased mobility;Decreased coordination;Decreased cognition;Decreased safety awareness;Pain       PT Treatment Interventions DME instruction;Gait training;Functional mobility training;Therapeutic activities;Therapeutic exercise;Balance training;Neuromuscular re-education;Cognitive remediation;Patient/family education    PT Goals (Current goals can be found in the Care Plan section)  Acute Rehab PT Goals Patient Stated Goal: Unstated PT Goal Formulation: With family Time For Goal Achievement: 07/28/17 Potential to Achieve Goals: Fair    Frequency Min 2X/week   Barriers to discharge        Co-evaluation PT/OT/SLP Co-Evaluation/Treatment: Yes Reason for Co-Treatment: Complexity of the patient's impairments (multi-system involvement);For patient/therapist safety PT goals addressed during session: Mobility/safety with mobility OT goals addressed during session: ADL's and self-care       AM-PAC PT "6 Clicks" Daily Activity  Outcome Measure Difficulty turning over in bed (including adjusting bedclothes, sheets and blankets)?: Unable Difficulty moving from lying on back  to sitting on the side of the bed? : Unable Difficulty sitting down on and standing up from a chair with arms (e.g., wheelchair, bedside commode, etc,.)?: Unable Help needed moving to and from a bed to chair (including a wheelchair)?: Total Help needed walking in hospital room?: Total Help needed climbing 3-5 steps with a railing? : Total 6 Click Score: 6    End of Session   Activity Tolerance: Patient limited by pain Patient left: in chair;with call bell/phone within reach;with chair alarm set;with family/visitor present Nurse Communication: Mobility status PT Visit Diagnosis: Muscle weakness (generalized) (M62.81);Pain Pain - Right/Left: Left Pain - part of body:  (Bilateral LEs)    Time: 2229-7989 PT Time Calculation (min) (ACUTE ONLY): 24 min   Charges:   PT Evaluation $PT Eval Moderate Complexity: 1 Mod     PT G Codes:        Alben Deeds, PT DPT  Board Certified Neurologic Specialist [place] 07/14/2017, 1:51 PM

## 2017-07-14 NOTE — Care Management Obs Status (Signed)
[place] NOTIFICATION   Patient Details  Name: Dana Harrington MRN: [id number] Date of Birth: [birth year]   Medicare Observation Status Notification Given:  (S) Yes (Dr. who is POA signed Obs Letter )    Ninfa Meeker, RN 07/14/2017, 2:47 PM

## 2017-07-14 NOTE — Evaluation (Signed)
Occupational Therapy Evaluation Patient Details Name: Dana Harrington MRN: [id number] DOB: 07-Aug-1941 Today's Date: 07/14/2017    History of Present Illness 76 y.o. female with medical history significant for initial diagnosis of epilepsy at 57 months of age and had been without seizures/quiescent until she had to episodes of seizures both in March 2018 and again in August 2018 in the context of acute CVA (now on AEDs by outpatient neurology), initial non-embolic CVA March 7829 with associated left-sided weakness. 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   Clinical Impression   PTA, pt was living with her daughter who assisted with all ADLs; recently daughter has been assisting with feeding as well. Pt performing ADLs with Max-total A and sheet transfer from bed to recliner with total A +2. Pt would benefit from further acute OT to facilitate safe dc. Recommend dc SNF for further OT to increase safety and independence with ADLs and functional mobility as well as decreased caregiver burden.    Follow Up Recommendations  SNF    Equipment Recommendations  Other (comment) (Defer to next venue)    Recommendations for Other Services PT consult     Precautions / Restrictions Precautions Precautions: Fall Restrictions Weight Bearing Restrictions: No      Mobility Bed Mobility Overal bed mobility: Needs Assistance Bed Mobility: Supine to Sit     Supine to sit: +2 for physical assistance;HOB elevated;Total assist     General bed mobility comments: Total A to transition hips towards EOB with use of bed pad. Max A to move BLEs and elevate trunk  Transfers Overall transfer level: Needs assistance               General transfer comment: lateral transfer with use of sheet to slide pt from bed to drop arm recliner with Total A +2 to facilitate movement of BLEs and trunk.     Balance                                           ADL  either performed or assessed with clinical judgement   ADL Overall ADL's : Needs assistance/impaired Eating/Feeding: Maximal assistance;Sitting;Bed level (supported sitting) Eating/Feeding Details (indicate cue type and reason): Upon arrival, pt daughter was feeding pt apple sauce while at bed level. Pt able to bring hand to mouth. Encouraged family to let pt particapte in self feeding Grooming: Maximal assistance;Sitting;Wash/dry face Grooming Details (indicate cue type and reason): Max A to maintain sitting posture at EOB and required Max cues to bring hand to mouth.  Upper Body Bathing: Maximal assistance;Bed level   Lower Body Bathing: +2 for physical assistance;Bed level;Total assistance   Upper Body Dressing : Maximal assistance;Bed level   Lower Body Dressing: Bed level;Total assistance                 General ADL Comments: Pt is Max-total A for all ADLs. Pt required Max A +2 to perform sheet slide transafer from EOB to recliner. Educated daughter on increasing pt's participate in [place].      Vision         Perception     Praxis      Pertinent Vitals/Pain Pain Assessment: Faces Faces Pain Scale: Hurts even more Pain Location: Bil Knees Pain Descriptors / Indicators: Constant;Discomfort;Grimacing;Guarding;Moaning Pain Intervention(s): Monitored during session;Limited activity within patient's tolerance;Repositioned     Hand Dominance Right  Extremity/Trunk Assessment Upper Extremity Assessment Upper Extremity Assessment: Generalized weakness;LUE deficits/detail LUE Deficits / Details: Generalized weakness with significant weakness in LUE LUE Coordination: decreased fine motor;decreased gross motor   Lower Extremity Assessment Lower Extremity Assessment: Defer to PT evaluation   Cervical / Trunk Assessment Cervical / Trunk Assessment: Kyphotic;Other exceptions Cervical / Trunk Exceptions: Lower back pain   Communication Communication Communication:  Expressive difficulties   Cognition Arousal/Alertness: Awake/alert Behavior During Therapy: Anxious Overall Cognitive Status: History of cognitive impairments - at baseline                                 General Comments: Pt anxious to move and required cues throughout to increase communication and follow commands   General Comments  Pt daughter present throughout session    Exercises     Shoulder Instructions      Home Living Family/patient expects to be discharged to:: Private residence Living Arrangements: Children Available Help at Discharge: Family;Available 24 hours/day Type of Home: House Home Access: Stairs to enter           [employer] Shower/Tub: Occupational psychologist: Standard     Home Equipment: Grab bars - tub/shower;Bedside commode;Shower seat - built in;Hand held Tourist information centre manager - 2 wheels;Cane - single point;Cane - quad (Adjustable bed)      Lives With: Family    Prior Functioning/Environment Level of Independence: Needs assistance  Gait / Transfers Assistance Needed: Use RW ADL's / Homemaking Assistance Needed: Daughter assist with all ADLs and recent has been feeding pt            OT Problem List: Decreased strength;Decreased range of motion;Decreased activity tolerance;Impaired balance (sitting and/or standing);Decreased cognition;Decreased safety awareness;Decreased knowledge of use of DME or AE;Decreased knowledge of precautions;Pain      OT Treatment/Interventions: Self-care/ADL training;Therapeutic exercise;Energy conservation;DME and/or AE instruction;Therapeutic activities;Patient/family education    OT Goals(Current goals can be found in the care plan section) Acute Rehab OT Goals Patient Stated Goal: Unstated OT Goal Formulation: With patient/family Time For Goal Achievement: 07/28/17 Potential to Achieve Goals: Good ADL Goals Pt Will Perform Eating: with min assist;with adaptive utensils;sitting;bed level  (supported sitting) Pt Will Perform Grooming: with min assist;sitting;bed level (supported sitting) Pt Will Perform Upper Body Dressing: with min assist;sitting;bed level  OT Frequency: Min 2X/week   Barriers to D/C:            Co-evaluation PT/OT/SLP Co-Evaluation/Treatment: Yes Reason for Co-Treatment: Complexity of the patient's impairments (multi-system involvement);For patient/therapist safety PT goals addressed during session: Mobility/safety with mobility OT goals addressed during session: ADL's and self-care      AM-PAC PT "6 Clicks" Daily Activity     Outcome Measure Help from another person eating meals?: A Lot Help from another person taking care of personal grooming?: A Lot Help from another person toileting, which includes using toliet, bedpan, or urinal?: Total Help from another person bathing (including washing, rinsing, drying)?: Total Help from another person to put on and taking off regular upper body clothing?: A Lot Help from another person to put on and taking off regular lower body clothing?: Total 6 Click Score: 9   End of Session Nurse Communication: Mobility status  Activity Tolerance: Patient limited by pain Patient left: in chair;with call bell/phone within reach;with family/visitor present;with chair alarm set  OT Visit Diagnosis: Unsteadiness on feet (R26.81);Other abnormalities of gait and mobility (R26.89);Muscle weakness (generalized) (M62.81);Other symptoms and signs  involving cognitive function                Time: 3785-8850 OT Time Calculation (min): 38 min Charges:  OT General Charges $OT Visit: 1 Visit OT Evaluation $OT Eval Moderate Complexity: 1 Mod OT Treatments $Self Care/Home Management : 8-22 mins G-Codes: OT G-codes **NOT FOR INPATIENT CLASS** Functional Assessment Tool Used: Clinical judgement Functional Limitation: Self care Self Care Current Status (Y7741): At least 80 percent but less than 100 percent impaired, limited or  restricted Self Care Goal Status (O8786): At least 40 percent but less than 60 percent impaired, limited or restricted   [place], OTR/L Acute Rehab Pager: [phone] Office: [place] 07/14/2017, 12:58 PM

## 2017-07-14 NOTE — Consult Note (Addendum)
Requesting Physician: Dr. Eliseo Squires    Chief Complaint: stroke versus seizure  History obtained from:  Patient    HPI:                                                                                                                                         LUKE RIGSBEE is an 76 y.o. female who has a history of seizure ever since very early childhood. Per the daughter the majority of her seizures are tonic-clonic. She currently is on a Zonegran 100 mg twice a day and Depakote 500 mg Depakote in the morning and 1000 night. She has not had a seizure for quite a while.  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 She is a illiterate but able to count fingers, state colors and follow commands.  She does not know the month, date, year she would not be able to baseline.  Per the daughter who was not at the scene, her husnband was feeding her when she was unable to take her pills and then appeared to have a right facial droop but the way she describes is that the left face drew sideways in a downward position and she was unable to get her words out. She was then noticed the weaker on the right side. For that reason she was taken to the emergency department.  MRI was obtained and did show a DWI abnormality corresponding to a subacute to chronic area of infarction in the right cerebral hemisphere - which upon discussion with the neuroradiologist, seems to have some amount of isointense ADC and rest of it is now progressing towards T2 shine through.  Daughter does state that during the last stroke she did have what she believes was a Holter monitor. She states this did not show any abnormality (we do not have the reports). Patient currently is hooked up to telemetry. No witnessed seizure this time but prior strokes were diagnosed after she had seizures and was taken in for evaluation of seizures. Patient has been on DAPT since  August.  One of the questions for neurology is what preventative therapy should the patient be on for long term?  Date last known well: Date: 07/13/2017 Time last known well: Unable to determine tPA Given: No: out of window NIHSS-12 Modified Rankin: Rankin Score=4  Past Medical History:  Diagnosis Date  . Aortic stenosis   . Arthritis    "qwhere" (12/19/2016)  . Chronic lower back pain   . Complication of anesthesia    "she can't be in a deep anesthesia; she's sleeps long after she's suppose to wake up" (12/19/2016)  . GERD (gastroesophageal reflux disease)   . Gout   . Hypertension   . Hypothyroidism   . LFT elevation   . Paronychia of fourth toe of right foot   .  Seizure disorder ([place])    since age 8  . 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 Stroke ([place]) 12/19/2016   left arm and leg weakness, facial droop, slurred speech/notes 12/19/2016  . Stroke ([place])    04/2017  . Vitamin D deficiency   . Zoster 02/2009    Past Surgical History:  Procedure Laterality Date  . LAPAROSCOPIC CHOLECYSTECTOMY  11/2003  . TUBAL LIGATION  1970    Family History  Problem Relation Age of Onset  . Heart disease Father   . Heart attack Father        Age 80  . Dementia Mother        Age 24  . Stroke Mother   . Seizures Brother   . Sleep disorder Sister    Social History:  reports that she has never smoked. She has quit using smokeless tobacco. She reports that she does not drink alcohol or use drugs.  Allergies:  Allergies  Allergen Reactions  . Gabapentin Other (See Comments)    "Bouncing"   . Penicillins Rash    Has patient had a PCN reaction causing immediate rash, facial/tongue/throat swelling, SOB or lightheadedness with hypotension: rash Has patient had a PCN reaction causing severe rash involving mucus membranes or skin necrosis: yes Has patient had a PCN reaction that required hospitalization: unk Has patient  had a PCN reaction occurring within the last 10 years: no If all of the above answers are "NO", then may proceed with Cephalosporin use.    Medications:                                                                                                                           Prior to Admission:  Prescriptions Prior to Admission  Medication Sig Dispense Refill Last Dose  . acetaminophen (TYLENOL) 325 MG tablet Take 325 mg by mouth every 6 (six) hours as needed for pain.   unk at [employer]  . ALPRAZolam (XANAX) 0.5 MG tablet Take 0.5 mg by mouth daily as needed for anxiety.  0 Past Week at Unknown time  . aspirin EC 325 MG tablet Take 1 tablet (325 mg total) by mouth daily. 30 tablet 0 07/12/2017 at Unknown time  . Butalbital-APAP-Caffeine (FIORICET) 50-300-40 MG CAPS As needed for headache (Patient taking differently: Take 1 capsule by mouth daily as needed (headache). As needed for headache) 10 capsule 5 Past Month at Unknown time  . carvedilol (COREG) 12.5 MG tablet Take 12.5 mg by mouth 2 (two) times daily with a meal.   07/12/2017 at 1900  . clopidogrel (PLAVIX) 75 MG tablet Take 1 tablet (75 mg total) by mouth daily. 90 tablet 4 07/12/2017 at 0800  . divalproex (DEPAKOTE) 500 MG DR tablet Taking 500mg  in am and 1000mg  in pm. (Patient taking differently: Take 500-1,000 mg by mouth See admin instructions. Taking 500mg  in am and 1000mg  in pm.)  270 tablet 4 07/12/2017 at Unknown time  . febuxostat (ULORIC) 40 MG tablet Take 40 mg by mouth daily.   07/12/2017 at Unknown time  . levothyroxine (SYNTHROID, LEVOTHROID) 100 MCG tablet Take 100 mcg by mouth daily before breakfast.   07/12/2017 at Unknown time  . loratadine-pseudoephedrine (CLARITIN-D 24-HOUR) 10-240 MG 24 hr tablet Take 1 tablet by mouth daily.   07/12/2017 at Unknown time  . methocarbamol (ROBAXIN) 500 MG tablet Take 500 mg by mouth every 6 (six) hours.   2 07/13/2017 at Unknown time  . omeprazole (PRILOSEC) 40 MG capsule Take 1 capsule by  mouth 2 (two) times daily.   3 07/12/2017 at Unknown time  . oxyCODONE-acetaminophen (PERCOCET/ROXICET) 5-325 MG tablet TAKE 1 TABLET BY MOUTH EVERY 6 HOURS AS NEEDED FOR PAIN  0 07/12/2017 at Unknown time  . pravastatin (PRAVACHOL) 40 MG tablet Take 40 mg by mouth daily.   07/12/2017 at Unknown time  . Vitamin D, Ergocalciferol, (DRISDOL) 50000 units CAPS capsule Take 50,000 Units by mouth once a week.  12 Past Week at Unknown time  . zonisamide (ZONEGRAN) 100 MG capsule Take 1 capsule (100 mg total) by mouth 2 (two) times daily. 180 capsule 4 07/12/2017 at Unknown time   ROS:                                                                                                                                       ROS & History obtained from daughter General ROS: negative for - chills, fatigue, fever, night sweats, weight gain or weight loss Psychological ROS: negative for - behavioral disorder, hallucinations, memory difficulties, mood swings or suicidal ideation Ophthalmic ROS: negative for - blurry vision, double vision, eye pain or loss of vision ENT ROS: negative for - epistaxis, nasal discharge, oral lesions, sore throat, tinnitus or vertigo Allergy and Immunology ROS: negative for - hives or itchy/watery eyes Hematological and Lymphatic ROS: negative for - bleeding problems, bruising or swollen lymph nodes Endocrine ROS: negative for - galactorrhea, hair pattern changes, polydipsia/polyuria or temperature intolerance Respiratory ROS: negative for - cough, hemoptysis, shortness of breath or wheezing Cardiovascular ROS: negative for - chest pain, dyspnea on exertion, edema or irregular heartbeat Gastrointestinal ROS: negative for - abdominal pain, diarrhea, hematemesis, nausea/vomiting or stool incontinence Genito-Urinary ROS: negative for - dysuria, hematuria, incontinence or urinary frequency/urgency Musculoskeletal ROS: negative for - joint swelling or muscular weakness Neurological ROS: as  noted in HPI Dermatological ROS: negative for rash and skin lesion changes  Neurologic Examination:  Blood pressure (!) 132/40, pulse 72, temperature 98.8 F (37.1 C), temperature source Oral, resp. rate 20, height 5\' 2"  (1.575 m), weight 62.4 kg (137 lb 9.1 oz), SpO2 97 %. HEENT-  Normocephalic, no lesions, without obvious abnormality.  Normal external eye and conjunctiva.  Normal TM's bilaterally.  Normal auditory canals and external ears. Normal external nose, mucus membranes and septum.  Normal pharynx. Cardiovascular- S1, S2 normal, pulses palpable throughout   Lungs- chest clear, no wheezing, rales, normal symmetric air entry Abdomen- normal findings: bowel sounds normal Extremities- no edema Lymph-no adenopathy palpable Musculoskeletal-no joint tenderness, deformity or swelling Skin-warm and dry, no hyperpigmentation, vitiligo, or suspicious lesions Neurological Examination Mental Status: Alert, oriented 2 the hospital however she is unable to tell me the date, month, year however this would be her baseline. Speech fluent without evidence of aphasia.  Able to follow simple step commands without difficulty. Cranial Nerves: II: ; Visual fields grossly normal,  III,IV, VI: ptosis not present, extra-ocular motions intact bilaterally, pupils equal, round, reactive to light and accommodation V,VII: asymmetric smile with saliva drooling from RIGHT angle of the mouth, on smiling does have flattened NLF. VIII: hearing normal bilaterally IX,X: uvula rises symmetrically XI: bilateral shoulder shrug XII: midline tongue extension Motor: Right : Upper extremity   5/5    Left:     Upper extremity   4/5  Bilateral lower extremities patient has 1/5 strength and has significant pain when I attempted to maneuver her ankles Tone and bulk:normal tone throughout; no atrophy noted Sensory: Pinprick and  light touch intact throughout, bilaterally Deep Tendon Reflexes: 2+ and symmetric throughout upper extremity with no ankle jerk or knee jerk. Plantars: Mute bilaterally Cerebellar: normal finger-to-nose on the right and difficult on the left secondary to strength I could not get a heel to shin secondary to immobility of legs and pain Gait: not tested  Lab Results: Basic Metabolic Panel:  Recent Labs Lab 07/13/17 1117 07/13/17 1133  NA 139 143  K 4.0 4.0  CL 110 110  CO2 24  --   GLUCOSE 90 82  BUN 27* 29*  CREATININE 1.17* 1.10*  CALCIUM 9.3  --    Liver Function Tests:  Recent Labs Lab 07/13/17 1117  AST 27  ALT 12*  ALKPHOS 65  BILITOT 0.8  PROT 7.0  ALBUMIN 2.7*   CBC:  Recent Labs Lab 07/13/17 1117 07/13/17 1133  WBC 9.0  --   NEUTROABS 4.5  --   HGB 14.1 15.0  HCT 44.4 44.0  MCV 95.1  --   PLT 110*  --    Lipid Panel:  Recent Labs Lab 07/14/17 0459  CHOL 135  TRIG 207*  HDL 28*  CHOLHDL 4.8  VLDL 41*  LDLCALC 66   LDL less than 70 from prior admission, A1c within normal limits from prior admission.  Imaging: Dg Chest 2 View  Result Date: 07/13/2017 CLINICAL DATA:  Stroke-like symptoms EXAM: CHEST  2 VIEW COMPARISON:  12/19/2016 FINDINGS: Cardiac shadow is enlarged. Aortic calcifications are again seen. The lungs are well aerated bilaterally. No focal infiltrate or sizable effusion is seen. No acute bony abnormality is noted. IMPRESSION: Stable cardiomegaly.  No acute abnormality seen. Electronically Signed   By: Inez Catalina M.D.   On: 07/13/2017 19:11   Ct Head Wo Contrast  Result Date: 07/13/2017 CLINICAL DATA:  Slurred speech, facial droop. EXAM: CT HEAD WITHOUT CONTRAST TECHNIQUE: Contiguous axial images were obtained from the base of the skull through the vertex  without intravenous contrast. COMPARISON:  CT scan of December 19, 2016. FINDINGS: Brain: Mild diffuse cortical atrophy is noted. No mass effect or midline shift is noted.  Ventricular size is within normal limits. There is no evidence of mass lesion or hemorrhage. New focal low density is noted in right periventricular white matter concerning for infarction of indeterminate age. Vascular: No hyperdense vessel or unexpected calcification. Skull: Normal. Negative for fracture or focal lesion. Sinuses/Orbits: No acute finding. Other: None. IMPRESSION: Mild diffuse cortical atrophy. New focal low density seen in right periventricular white matter concerning for infarction of indeterminate age; MRI is recommended for further evaluation. Electronically Signed   By: Marijo Conception, M.D.   On: 07/13/2017 12:03   Mr Jeri Cos NF Contrast  Result Date: 07/13/2017 CLINICAL DATA:  76 year old female with facial droop and slurred speech. Age indeterminate right hemisphere hypodensity on head CT today. EXAM: MRI HEAD WITHOUT AND WITH CONTRAST TECHNIQUE: Multiplanar, multiecho pulse sequences of the brain and surrounding structures were obtained without and with intravenous contrast. CONTRAST:  2mL MULTIHANCE GADOBENATE DIMEGLUMINE 529 MG/ML IV SOLN COMPARISON:  Head CT 1140 hours today.  Brain MRI 12/19/2016 FINDINGS: Brain: 2 cm area of abnormal trace diffusion along the posterior right corona radiata and subcortical white matter tracking toward the posterior right operculum (series 5, image 15. This appears partially restricted (series 550, image 15) with well developed T2 and FLAIR hyperintensity. This lesion is not far from the smaller posterior corona radiata white matter infarct which occurred in March. Only mild associated T1 hypointensity. No associated hemorrhage or mass effect. No other restricted diffusion. No midline shift, mass effect, evidence of mass lesion, ventriculomegaly, extra-axial collection or acute intracranial hemorrhage. Cervicomedullary junction and pituitary are within normal limits. Chronic encephalomalacia in the left occipital pole is stable. Stable gray and white  matter signal outside of the acute abnormality. Hyperostosis of the calvarium, no definite abnormal enhancement. Vascular: Major intracranial vascular flow voids appear stable since March. Skull and upper cervical spine: Calvarium hyperostosis. Stable bone marrow signal. Negative for age visible cervical spine. Sinuses/Orbits: Negative orbits soft tissues. Improved paranasal sinus aeration since March. Other: Visible internal auditory structures appear normal. Mastoid air cells remain clear. 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 IMPRESSION: 1. CT finding earlier today corresponds to an acute to subacute mostly white matter infarct in the posterior right MCA territory. This is near the area affected by the lacunar infarct in March this year. No associated hemorrhage or mass effect. 2. 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 Outpatient ENT consultation recommended if not already done. 3. 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 Electronically Signed   By: Genevie Ann M.D.   On: 07/13/2017 14:18   Assessment and plan discussed with with attending physician and they are in agreement.    Etta Quill PA-C [facility] [phone]  07/14/2017, 11:32 AM   Assessment: 76 y.o. female result in the hospital with difficulty swallowing, transient difficulty with speech, right facial droop on top of the pre-existing left-sided weakness and left facial droop.  Given the description of the event I question if she may have actually had a seizure.  In addition, the MRI imaging reviewed by me and discussed with neuroradiology shows an area of restricted diffusion very close to the area of restricted diffusion that was seen on the prior MRI a few months ago, which is not completely restricting  but is moving towards an isointense ADC map-which would indicate that this is probably the evolution of her old  stroke or could reflect changes because of seizure edema. In addition patient is suffering from a UTI which would lower her seizure threshold in the setting of structural abnormalities in the brain from prior strokes.  Impression Evaluate for new stroke vs Stroke recrudescence vs Possible seizure followed by postictal weakness  Stroke Risk Factors - hypertension  Recommend HgbA1c, fasting lipid has been done and is within normal limits MRA of the brain without contrast PT consult, OT consult, Speech consult echocardiogram--followed possibly by TEE 80 mg of Atorvistatin Continual dual antiplatelets for now. Further recommendation on whether this needs to be changed anticoagulation based on clinical course and test results. Risk factor modification Telemetry monitoring Frequent neuro checks NPO until passes stroke swallow screen EEG C/w current AEDs Seizure precautions  Please page stroke NP  Or  PA  Or MD from 8am -4 pm  as this patient from this time will be  followed by the stroke.   You can look them up on [email or web address]  Password [email or web address]   -- Amie Portland, MD [facility] [phone]  If 7pm to 7am, please call on call as listed on AMION.

## 2017-07-14 NOTE — Progress Notes (Signed)
EEG complete - results pending 

## 2017-07-14 NOTE — Progress Notes (Addendum)
PROGRESS NOTE    SPIRIT WERNLI  ENI:[id number] DOB: 1941-02-09 DOA: 07/13/2017 PCP: Jani Gravel, MD   Outpatient Specialists:     Brief Narrative:  Dana Harrington is a 76 y.o. female with medical history significant for initial diagnosis of epilepsy at 63 months of age and had been without seizures/quiescent until she had to episodes of seizures both in March 2018 and again in August 2018 in the context of acute CVA (now on AEDs by outpatient neurology), initial non-embolic CVA March 6144 with associated left-sided weakness. (discharged on aspirin). Recurrent stroke August 2018 and was admitted to [facility] in [place] with her presenting symptom being seizure activity. Stroke at that time was located in the right parietal lobe. He was discharged on aspirin with Plavix. Other medical history includes stage III chronic kidney disease diastolic dysfunction in the context of pulmonary hypertension and tricuspid regurgitation, hypertension, hypothyroidism and aortic stenosis.   Patient was in her usual state of health until early this morning. Occasionally she requires Robaxin for muscle spasms and awakened at 3 AM to take a dose of Robaxin. She went back to bed and was awakened at 9 AM by her son who noticed she had slurred speech and appeared to be aphasic having difficulty speaking and forming words. She was also weak on her right side with right facial drooping. She was brought to the ER for further evaluation and treatment. Noncontrasted CT of the head performed in the ER revealed new focal low density area seen in the right periventricular white matter concerning for infarction of indeterminate age.. Neurology consulted and will see the patient formally. MRI of the brain recommended and patient has been taken to MR before could be examined by the internal medicine team. Of note previous strokes have only been detected on MRI. The patient finally returned from MR around 2:15  PM  In my discussion with the daughter and the patient the patient has had intermittent episodes of recurrent drooling since her last stroke. Daughter reports that after rehabilitation patient has been inconsistent with performing rehabilitative therapies including oral pharyngeal exercises recommended by SLP. Patient has chronic poor oral intake despite utilization of protein supplementation and appetite stimulants. Daughter reports urine has been concentrated in the past week. No fevers or chills. No other upper respiratory infection symptoms or gastrointestinal infection symptoms. Recent poor oral intake although does better when fed. Daughter reports that has declined physically since discharge from rehabilitation in August.   Assessment & Plan:   Principal Problem:   Stroke-like symptoms Active Problems:   History of CVA (cerebrovascular accident)   Hypertension   Seizure disorder ([place])   CKD (chronic kidney disease), stage III (HCC)   Hypothyroidism, adult   Diastolic dysfunction   Pulmonary HTN (HCC)   Tricuspid regurgitation   Thrombocytopenia (HCC)   left Parotid mass   CVA: [id number] to subacute mostly white matter infarct in the posterior right MCA territory. -MRA pending -Neurology consulted-patient with recurrent stroke despite dual antiplatelet therapy  -PT/OT/SOP evaluations -HgbA1c was 5.6 in March -on Pravachol prior to admission -Underwent echocardiogram in March but will repeat -Carotid duplex in March therefore no utility in repeating -Frequent neurological checks -Allow for permissive hypertension in presumed acute stroke    Hypertension -Current blood pressure well controlled but allowing for permissive hypertension    Seizure disorder  -Followed by Dr. 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 incidental finding on MRI brain -Radiologist recommends outpatient ENT evaluation    DVT prophylaxis:  Lovenox   Code Status: Full Code   Family Communication:   Disposition Plan:     Consultants:   Neuro   Subjective: Worried about not being able to swallow  Objective: Vitals:   07/14/17 0200 07/14/17 0400 07/14/17 0518 07/14/17 0600  BP: (!) 131/37 (!) 144/42  (!) 132/40  Pulse: 70 74  72  Resp:      Temp:      TempSrc:      SpO2:      Weight:   62.4 kg (137 lb 9.1 oz)   Height:        Intake/Output Summary (Last 24 hours) at 07/14/17 1236 Last data filed at 07/14/17 0500  Gross per 24 hour  Intake             1315 ml  Output              500 ml  Net              815 ml   Filed Weights   07/13/17 1650 07/14/17 0518  Weight: 73.2 kg (161 lb 6 oz) 62.4 kg (137 lb 9.1 oz)    Examination:  General exam: chronically ill appearing Respiratory system: no wheezing Cardiovascular system: S1 & S2 heard, RRR. No JVD, murmurs, rubs, gallops or clicks. No pedal edema. Gastrointestinal system: Abdomen is nondistended, soft and nontender. No organomegaly or masses felt. Normal bowel sounds heard. Central nervous system: Alert- pleasant-- slightly slurred speech Skin: No rashes, lesions or ulcers Psychiatry:  Mood & affect appropriate.     Data Reviewed: I have personally reviewed following labs and imaging studies  CBC:  Recent Labs Lab 07/13/17 1117 07/13/17 1133  WBC 9.0  --   NEUTROABS 4.5  --   HGB 14.1 15.0  HCT 44.4 44.0  MCV 95.1  --   PLT 110*  --    Basic Metabolic Panel:  Recent Labs Lab  07/13/17 1117 07/13/17 1133  NA 139 143  K 4.0 4.0  CL 110 110  CO2 24  --   GLUCOSE 90 82  BUN 27* 29*  CREATININE 1.17* 1.10*  CALCIUM 9.3  --    GFR: Estimated Creatinine Clearance: 37.2 mL/min (A) (by C-G formula based on SCr of 1.1 mg/dL (H)). Liver Function Tests:  Recent Labs Lab 07/13/17 1117  AST 27  ALT 12*  ALKPHOS 65  BILITOT 0.8  PROT 7.0  ALBUMIN 2.7*   No results for input(s): LIPASE, AMYLASE in the last 168 hours. No results for input(s): AMMONIA in the last 168 hours. Coagulation Profile:  Recent Labs Lab 07/13/17 1117  INR 1.04   Cardiac Enzymes: No results for input(s): CKTOTAL, CKMB, CKMBINDEX, TROPONINI in the last 168 hours. BNP (last 3 results) No results for input(s): PROBNP in the last 8760 hours. HbA1C:  Recent Labs  07/14/17 0459  HGBA1C 5.2   CBG: No results for input(s): GLUCAP in the last 168 hours. Lipid Profile:  Recent Labs  07/14/17 0459  CHOL  135  HDL 28*  LDLCALC 66  TRIG 207*  CHOLHDL 4.8   Thyroid Function Tests: No results for input(s): TSH, T4TOTAL, FREET4, T3FREE, THYROIDAB in the last 72 hours. Anemia Panel: No results for input(s): VITAMINB12, FOLATE, FERRITIN, TIBC, IRON, RETICCTPCT in the last 72 hours. Urine analysis:    Component Value Date/Time   COLORURINE YELLOW 07/13/2017 1410   APPEARANCEUR HAZY (A) 07/13/2017 1410   LABSPEC 1.028 07/13/2017 1410   PHURINE 5.0 07/13/2017 1410   GLUCOSEU NEGATIVE 07/13/2017 1410   HGBUR NEGATIVE 07/13/2017 1410   BILIRUBINUR NEGATIVE 07/13/2017 1410   KETONESUR NEGATIVE 07/13/2017 1410   PROTEINUR NEGATIVE 07/13/2017 1410   UROBILINOGEN 0.2 07/18/2015 0108   NITRITE NEGATIVE 07/13/2017 1410   LEUKOCYTESUR LARGE (A) 07/13/2017 1410     )No results found for this or any previous visit (from the past 240 hour(s)).    Anti-infectives    Start     Dose/Rate Route Frequency Ordered Stop   07/13/17 1515  cefTRIAXone (ROCEPHIN) 1 g in dextrose 5 % 50 mL  IVPB     1 g 100 mL/hr over 30 Minutes Intravenous Every 24 hours 07/13/17 1505         Radiology Studies: Dg Chest 2 View  Result Date: 07/13/2017 CLINICAL DATA:  Stroke-like symptoms EXAM: CHEST  2 VIEW COMPARISON:  12/19/2016 FINDINGS: Cardiac shadow is enlarged. Aortic calcifications are again seen. The lungs are well aerated bilaterally. No focal infiltrate or sizable effusion is seen. No acute bony abnormality is noted. IMPRESSION: Stable cardiomegaly.  No acute abnormality seen. Electronically Signed   By: Inez Catalina M.D.   On: 07/13/2017 19:11   Ct Head Wo Contrast  Result Date: 07/13/2017 CLINICAL DATA:  Slurred speech, facial droop. EXAM: CT HEAD WITHOUT CONTRAST TECHNIQUE: Contiguous axial images were obtained from the base of the skull through the vertex without intravenous contrast. COMPARISON:  CT scan of December 19, 2016. FINDINGS: Brain: Mild diffuse cortical atrophy is noted. No mass effect or midline shift is noted. Ventricular size is within normal limits. There is no evidence of mass lesion or hemorrhage. New focal low density is noted in right periventricular white matter concerning for infarction of indeterminate age. Vascular: No hyperdense vessel or unexpected calcification. Skull: Normal. Negative for fracture or focal lesion. Sinuses/Orbits: No acute finding. Other: None. IMPRESSION: Mild diffuse cortical atrophy. New focal low density seen in right periventricular white matter concerning for infarction of indeterminate age; MRI is recommended for further evaluation. Electronically Signed   By: Marijo Conception, M.D.   On: 07/13/2017 12:03   Mr Jeri Cos IW Contrast  Result Date: 07/13/2017 CLINICAL DATA:  76 year old female with facial droop and slurred speech. Age indeterminate right hemisphere hypodensity on head CT today. EXAM: MRI HEAD WITHOUT AND WITH CONTRAST TECHNIQUE: Multiplanar, multiecho pulse sequences of the brain and surrounding structures were obtained  without and with intravenous contrast. CONTRAST:  70mL MULTIHANCE GADOBENATE DIMEGLUMINE 529 MG/ML IV SOLN COMPARISON:  Head CT 1140 hours today.  Brain MRI 12/19/2016 FINDINGS: Brain: 2 cm area of abnormal trace diffusion along the posterior right corona radiata and subcortical white matter tracking toward the posterior right operculum (series 5, image 15. This appears partially restricted (series 550, image 15) with well developed T2 and FLAIR hyperintensity. This lesion is not far from the smaller posterior corona radiata white matter infarct which occurred in March. Only mild associated T1 hypointensity. No associated hemorrhage or mass effect. No other restricted diffusion. No midline shift,  mass effect, evidence of mass lesion, ventriculomegaly, extra-axial collection or acute intracranial hemorrhage. Cervicomedullary junction and pituitary are within normal limits. Chronic encephalomalacia in the left occipital pole is stable. Stable gray and white matter signal outside of the acute abnormality. Hyperostosis of the calvarium, no definite abnormal enhancement. Vascular: Major intracranial vascular flow voids appear stable since March. Skull and upper cervical spine: Calvarium hyperostosis. Stable bone marrow signal. Negative for age visible cervical spine. Sinuses/Orbits: Negative orbits soft tissues. Improved paranasal sinus aeration since March. Other: Visible internal auditory structures appear normal. Mastoid air cells remain clear. 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 IMPRESSION: 1. CT finding earlier today corresponds to an acute to subacute mostly white matter infarct in the posterior right MCA territory. This is near the area affected by the lacunar infarct in March this year. No associated hemorrhage or mass effect. 2. 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 Outpatient ENT consultation recommended if  not already done. 3. 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 Electronically Signed   By: Genevie Ann M.D.   On: 07/13/2017 14:18        Scheduled Meds: . enoxaparin (LOVENOX) injection  40 mg Subcutaneous Q24H  . levothyroxine  50 mcg Intravenous QHS  . pneumococcal 23 valent vaccine  0.5 mL Intramuscular Tomorrow-1000   Continuous Infusions: . sodium chloride 75 mL/hr at 07/14/17 0544  . cefTRIAXone (ROCEPHIN)  IV Stopped (07/13/17 1546)  . valproate sodium 500 mg (07/14/17 0849)   And  . valproate sodium Stopped (07/13/17 2225)     LOS: 0 days    Time spent: 51 min    [place], DO [facility] Pager [phone]  If 7PM-7AM, please contact night-coverage [email or web address] Password [facility] 07/14/2017, 12:36 PM

## 2017-07-15 ENCOUNTER — Inpatient Hospital Stay (HOSPITAL_COMMUNITY): Payer: Medicare Other

## 2017-07-15 DIAGNOSIS — I5030 Unspecified diastolic (congestive) heart failure: Secondary | ICD-10-CM

## 2017-07-15 LAB — CBC
HEMATOCRIT: 40.6 % (ref 36.0–46.0)
Hemoglobin: 13 g/dL (ref 12.0–15.0)
MCH: 30.4 pg (ref 26.0–34.0)
MCHC: 32 g/dL (ref 30.0–36.0)
MCV: 94.9 fL (ref 78.0–100.0)
PLATELETS: 115 10*3/uL — AB (ref 150–400)
RBC: 4.28 MIL/uL (ref 3.87–5.11)
RDW: 14.3 % (ref 11.5–15.5)
WBC: 9.4 10*3/uL (ref 4.0–10.5)

## 2017-07-15 LAB — ECHOCARDIOGRAM COMPLETE
HEIGHTINCHES: 62 in
WEIGHTICAEL: 2201.07 [oz_av]

## 2017-07-15 LAB — BASIC METABOLIC PANEL
ANION GAP: 9 (ref 5–15)
BUN: 19 mg/dL (ref 6–20)
CO2: 23 mmol/L (ref 22–32)
Calcium: 8.9 mg/dL (ref 8.9–10.3)
Chloride: 108 mmol/L (ref 101–111)
Creatinine, Ser: 0.89 mg/dL (ref 0.44–1.00)
GFR calc Af Amer: >60 mL/min (ref >60)
GLUCOSE: 89 mg/dL (ref 65–99)
POTASSIUM: 3.7 mmol/L (ref 3.5–5.1)
Sodium: 140 mmol/L (ref 135–145)

## 2017-07-15 LAB — GLUCOSE, CAPILLARY: Glucose-Capillary: 86 mg/dL (ref 65–99)

## 2017-07-15 MED ORDER — DIVALPROEX SODIUM 500 MG PO DR TAB
1000.0000 mg | DELAYED_RELEASE_TABLET | Freq: Two times a day (BID) | ORAL | Status: DC
  Administered 2017-07-15 – 2017-07-16 (×2): 1000 mg via ORAL
  Filled 2017-07-15 (×4): qty 2

## 2017-07-15 MED ORDER — IOPAMIDOL (ISOVUE-370) INJECTION 76%
INTRAVENOUS | Status: AC
  Filled 2017-07-15: qty 50

## 2017-07-15 NOTE — Progress Notes (Signed)
PROGRESS NOTE    Dana Harrington  DQQ:[id number] DOB: 08/26/1941 DOA: 07/13/2017 PCP: Jani Gravel, MD   Outpatient Specialists:     Brief Narrative:  Dana Harrington is a 76 y.o. female with medical history significant for initial diagnosis of epilepsy at 67 months of age and had been without seizures/quiescent until she had to episodes of seizures both in March 2018 and again in August 2018 in the context of acute CVA (now on AEDs by outpatient neurology), initial non-embolic CVA March 1941 with associated left-sided weakness. (discharged on aspirin). Recurrent stroke August 2018 and was admitted to [facility] in [place] with her presenting symptom being seizure activity. Stroke at that time was located in the right parietal lobe. He was discharged on aspirin with Plavix. Other medical history includes stage III chronic kidney disease diastolic dysfunction in the context of pulmonary hypertension and tricuspid regurgitation, hypertension, hypothyroidism and aortic stenosis.   Patient was in her usual state of health until early this morning. Occasionally she requires Robaxin for muscle spasms and awakened at 3 AM to take a dose of Robaxin. She went back to bed and was awakened at 9 AM by her son who noticed she had slurred speech and appeared to be aphasic having difficulty speaking and forming words. She was also weak on her right side with right facial drooping. She was brought to the ER for further evaluation and treatment. Noncontrasted CT of the head performed in the ER revealed new focal low density area seen in the right periventricular white matter concerning for infarction of indeterminate age.. Neurology consulted and will see the patient formally. MRI of the brain recommended and patient has been taken to MR before could be examined by the internal medicine team. Of note previous strokes have only been detected on MRI. The patient finally returned from MR around 2:15  PM  In my discussion with the daughter and the patient the patient has had intermittent episodes of recurrent drooling since her last stroke. Daughter reports that after rehabilitation patient has been inconsistent with performing rehabilitative therapies including oral pharyngeal exercises recommended by SLP. Patient has chronic poor oral intake despite utilization of protein supplementation and appetite stimulants. Daughter reports urine has been concentrated in the past week. No fevers or chills. No other upper respiratory infection symptoms or gastrointestinal infection symptoms. Recent poor oral intake although does better when fed. Daughter reports that has declined physically since discharge from rehabilitation in August.   Assessment & Plan:   Principal Problem:   Stroke-like symptoms Active Problems:   History of CVA (cerebrovascular accident)   Hypertension   Seizure disorder ([place])   CKD (chronic kidney disease), stage III (HCC)   Hypothyroidism, adult   Diastolic dysfunction   Pulmonary HTN (HCC)   Tricuspid regurgitation   Thrombocytopenia (HCC)   left Parotid mass   CVA: [id number] to subacute mostly white matter infarct in the posterior right MCA territory. -MRA pending -Neurology consulted-patient with recurrent stroke despite dual antiplatelet therapy  -HgbA1c was 5.6 in March -on Pravachol prior to admission -Underwent echocardiogram in March but will repeat- normal -Carotid duplex in March therefore no utility in repeating    Hypertension -Current blood pressure well controlled but allowing for permissive hypertension    Seizure disorder  -Followed by Dr. 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 incidental finding on MRI brain -Radiologist recommends outpatient ENT evaluation    DVT prophylaxis:  Lovenox   Code Status: Full Code   Family Communication:   Disposition Plan:     Consultants:   Neuro   Subjective: Passed swallow, no new c/o today-- would prefer to go home with daughter rather than SNF  Objective: Vitals:   07/14/17 2008 07/15/17 0015 07/15/17 0432 07/15/17 1252  BP: (!) 123/39 (!) 143/41 134/63 (!) 140/46  Pulse: 75 67 78 74  Resp: 18 18 18 18   Temp: 98.2 F (36.8 C) 98.7 F (37.1 C) (!) 97.5 F (36.4 C) 98.3 F (36.8 C)  TempSrc: Oral Oral Oral Oral  SpO2: 98% 96% 97% 100%  Weight:      Height:        Intake/Output Summary (Last 24 hours) at 07/15/17 1608 Last data filed at 07/15/17 0616  Gross per 24 hour  Intake             1815 ml  Output             1000 ml  Net              815 ml   Filed Weights   07/13/17 1650 07/14/17 0518  Weight: 73.2 kg (161 lb 6 oz) 62.4 kg (137 lb 9.1 oz)    Examination:  General exam: in bed, NAD Respiratory system: no wheezing Cardiovascular system: rrr. Gastrointestinal system: +BS, soft Central nervous system: pleasant-- hard of hearing Psychiatry:  Normal mood    Data Reviewed: I have personally reviewed following labs and imaging studies  CBC:  Recent Labs Lab 07/13/17 1117 07/13/17 1133 07/15/17 0621  WBC 9.0  --  9.4  NEUTROABS 4.5  --   --   HGB 14.1 15.0 13.0  HCT 44.4 44.0 40.6  MCV 95.1  --  94.9  PLT 110*  --  644*   Basic Metabolic Panel:  Recent Labs Lab 07/13/17 1117 07/13/17 1133 07/15/17 0621  NA 139 143 140  K 4.0 4.0 3.7  CL 110 110 108  CO2 24  --  23  GLUCOSE 90 82 89  BUN 27* 29* 19   CREATININE 1.17* 1.10* 0.89  CALCIUM 9.3  --  8.9   GFR: Estimated Creatinine Clearance: 46 mL/min (by C-G formula based on SCr of 0.89 mg/dL). Liver Function Tests:  Recent Labs Lab 07/13/17 1117  AST 27  ALT 12*  ALKPHOS 65  BILITOT 0.8  PROT 7.0  ALBUMIN 2.7*   No results for input(s): LIPASE, AMYLASE in the last 168 hours. No results for input(s): AMMONIA in the last 168 hours. Coagulation Profile:  Recent Labs Lab 07/13/17 1117  INR 1.04   Cardiac Enzymes: No results for input(s): CKTOTAL, CKMB, CKMBINDEX, TROPONINI in the last 168 hours. BNP (last 3 results) No results for input(s): PROBNP in the last 8760 hours. HbA1C:  Recent Labs  07/14/17 0459  HGBA1C 5.2   CBG: No results for input(s): GLUCAP in the last 168 hours. Lipid Profile:  Recent Labs  07/14/17 0459  CHOL 135  HDL 28*  LDLCALC 66  TRIG 207*  CHOLHDL 4.8  Thyroid Function Tests: No results for input(s): TSH, T4TOTAL, FREET4, T3FREE, THYROIDAB in the last 72 hours. Anemia Panel: No results for input(s): VITAMINB12, FOLATE, FERRITIN, TIBC, IRON, RETICCTPCT in the last 72 hours. Urine analysis:    Component Value Date/Time   COLORURINE YELLOW 07/13/2017 1410   APPEARANCEUR HAZY (A) 07/13/2017 1410   LABSPEC 1.028 07/13/2017 1410   PHURINE 5.0 07/13/2017 1410   GLUCOSEU NEGATIVE 07/13/2017 1410   HGBUR NEGATIVE 07/13/2017 1410   BILIRUBINUR NEGATIVE 07/13/2017 1410   KETONESUR NEGATIVE 07/13/2017 1410   PROTEINUR NEGATIVE 07/13/2017 1410   UROBILINOGEN 0.2 07/18/2015 0108   NITRITE NEGATIVE 07/13/2017 1410   LEUKOCYTESUR LARGE (A) 07/13/2017 1410     ) Recent Results (from the past 240 hour(s))  Culture, Urine     Status: Abnormal   Collection Time: 07/13/17  2:10 PM  Result Value Ref Range Status   Specimen Description URINE, CLEAN CATCH  Final   Special Requests NONE  Final   Culture MULTIPLE SPECIES PRESENT, SUGGEST RECOLLECTION (A)  Final   Report Status 07/14/2017  FINAL  Final      Anti-infectives    Start     Dose/Rate Route Frequency Ordered Stop   07/13/17 1515  cefTRIAXone (ROCEPHIN) 1 g in dextrose 5 % 50 mL IVPB     1 g 100 mL/hr over 30 Minutes Intravenous Every 24 hours 07/13/17 1505 07/16/17 1514       Radiology Studies: Dg Chest 2 View  Result Date: 07/13/2017 CLINICAL DATA:  Stroke-like symptoms EXAM: CHEST  2 VIEW COMPARISON:  12/19/2016 FINDINGS: Cardiac shadow is enlarged. Aortic calcifications are again seen. The lungs are well aerated bilaterally. No focal infiltrate or sizable effusion is seen. No acute bony abnormality is noted. IMPRESSION: Stable cardiomegaly.  No acute abnormality seen. Electronically Signed   By: Inez Catalina M.D.   On: 07/13/2017 19:11        Scheduled Meds: .  stroke: mapping our early stages of recovery book   Does not apply Once  . aspirin EC  325 mg Oral Daily  . clopidogrel  75 mg Oral Daily  . divalproex  1,000 mg Oral Q12H  . enoxaparin (LOVENOX) injection  40 mg Subcutaneous Q24H  . levothyroxine  100 mcg Oral QAC breakfast  . pneumococcal 23 valent vaccine  0.5 mL Intramuscular Tomorrow-1000   Continuous Infusions: . cefTRIAXone (ROCEPHIN)  IV Stopped (07/14/17 1755)     LOS: 1 day    Time spent: 25 min    [place], DO [facility] Pager [phone]  If 7PM-7AM, please contact night-coverage [email or web address] Password [email or web address] 07/15/2017, 4:08 PM

## 2017-07-15 NOTE — Progress Notes (Signed)
  Speech Language Pathology Treatment: Dysphagia  Patient Details Name: Dana Harrington MRN: [id number] DOB: 1941/05/08 Today's Date: 07/15/2017 Time: [id number] SLP Time Calculation (min) (ACUTE ONLY): 18 min  Assessment / Plan / Recommendation Clinical Impression  Pt observed with AM meal; tolerated Dys 2 diet and thin liquids with no s/sx of aspiration noted. When given small sips via cup, no anterior spillage noticed. Pt is able to self-feed with minimal assist; meal positioning and assist with liquids required 2/2 unsteady hand coordination. Pt is at baseline per daughter report and tolerating diet with no concern for safety. Recommend continuation of Dys 2 (fine chopped) diet with thin liquids and meds crushed in puree. No further SLP tx needed; will sign off.    HPI HPI: Dana Harrington a 76 y.o.femalewith medical history significant for initial diagnosis of epilepsy at 31 months of age and had been without seizures/quiescent until she had two episodes of seizures both in March 2018 and again in August 2018 in the context of acute CVA, initial non-embolic CVA March 9201 with associated left-sided weakness. 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 Per daughter, patient has had intermittent episodes of recurrent drooling since her last stroke; after rehabilitation patient has been inconsistent with performing rehabilitative therapies including oral pharyngeal exercises recommended by SLP. Present admit for slurred speech and difficulty speaking. RT side weakness and RT facial drooping. CT finding 10/14 corresponds to an acute to subacute mostly white matter infarct in the posterior right MCA territory. MRI showed stable and indeterminate but probably benign 2.8 cm mass in the left parotid space. Chest xray shows no PNA.      SLP Plan  Discharge  SLP treatment due to (comment) (Pt at baseline)       Recommendations  Diet recommendations: Dysphagia 2 (fine chop);Thin liquid Liquids provided via: Cup;Straw Medication Administration: Crushed with puree Supervision: Staff to assist with self feeding Compensations: Small sips/bites;Monitor for anterior loss Postural Changes and/or Swallow Maneuvers: Seated upright 90 degrees;Upright 30-60 min after meal                Oral Care Recommendations: Oral care BID Follow up Recommendations: None SLP Visit Diagnosis: Frontal lobe and executive function deficit;Dysphagia, unspecified (R13.10) Plan: Discharge SLP treatment due to (comment) (Pt at baseline)       GO               Supervised and reviewed by Herbie [place] MA CCC-SLP  Aaron Edelman, Student SLP 07/15/2017, 10:50 AM

## 2017-07-15 NOTE — Progress Notes (Addendum)
Called by nurse that patient was extremely anxious about MRA and crying even after she took Xanax.  Since MRI is already completed, it is reasonable to instead order CTA head and neck of the patient and can therefore effectively avoid MRA overnight.  Addendum: patient refused both CTA and MRA.  Carlyon Shadow, M.D.

## 2017-07-15 NOTE — Progress Notes (Signed)
  Echocardiogram 2D Echocardiogram has been performed.  Dana Harrington T [name] 07/15/2017, 2:03 PM

## 2017-07-15 NOTE — Progress Notes (Signed)
STROKE TEAM PROGRESS NOTE   HISTORY OF PRESENT ILLNESS (per record) Dana Harrington is an 76 y.o. female who has a history of seizure ever since very early childhood. Per the daughter the majority of her seizures are tonic-clonic. She currently is on a Zonegran 100 mg twice a day and Depakote 500 mg Depakote in the morning and 1000 night. She has not had a seizure for quite a while.  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 She is a illiterate but able to count fingers, state colors and follow commands.  She does not know the month, date, year she would not be able to baseline.  Per the daughter who was not at the scene, her husnband was feeding her when she was unable to take her pills and then appeared to have a right facial droop but the way she describes is that the left face drew sideways in a downward position and she was unable to get her words out. She was then noticed the weaker on the right side. For that reason she was taken to the emergency department.  MRI was obtained and did show a DWI abnormality corresponding to a subacute to chronic area of infarction in the right cerebral hemisphere - which upon discussion with the neuroradiologist, seems to have some amount of isointense ADC and rest of it is now progressing towards T2 shine through.  Daughter does state that during the last stroke she did have what she believes was a Holter monitor. She states this did not show any abnormality (we do not have the reports). Patient currently is hooked up to telemetry. No witnessed seizure this time but prior strokes were diagnosed after she had seizures and was taken in for evaluation of seizures. Patient has been on DAPT since August.  One of the questions for neurology is what preventative therapy should the patient be on for long term?  Date last known well: Date: 07/13/2017 Time last known well: Unable to  determine tPA Given: No: out of window NIHSS-12 Modified Rankin: Rankin Score=4  SUBJECTIVE (INTERVAL HISTORY) She is resting comfortably this morning eating breakfast after successful swallowing evaluation by SLP therapists. Her aphasia appears to be improved and is answering questions appropriately. EEG was obtained yesterday showing mild epileptiform activity. She denies knowing of any seizures since her last stroke several years ago.her daughter is not available at the bedside   OBJECTIVE  CBC:   Recent Labs Lab 07/13/17 1117 07/13/17 1133 07/15/17 0621  WBC 9.0  --  9.4  NEUTROABS 4.5  --   --   HGB 14.1 15.0 13.0  HCT 44.4 44.0 40.6  MCV 95.1  --  94.9  PLT 110*  --  115*    Basic Metabolic Panel:   Recent Labs Lab 07/13/17 1117 07/13/17 1133 07/15/17 0621  NA 139 143 140  K 4.0 4.0 3.7  CL 110 110 108  CO2 24  --  23  GLUCOSE 90 82 89  BUN 27* 29* 19  CREATININE 1.17* 1.10* 0.89  CALCIUM 9.3  --  8.9    Lipid Panel:     Component Value Date/Time   CHOL 135 07/14/2017 0459   TRIG 207 (H) 07/14/2017 0459   HDL 28 (L) 07/14/2017 0459   CHOLHDL 4.8 07/14/2017 0459   VLDL 41 (H) 07/14/2017 0459   LDLCALC 66 07/14/2017 0459   HgbA1c:  Lab Results  Component Value Date   HGBA1C 5.2 07/14/2017   Urine Drug Screen:     Component Value Date/Time   LABOPIA POSITIVE (A) 07/13/2017 1410   COCAINSCRNUR NONE DETECTED 07/13/2017 1410   LABBENZ POSITIVE (A) 07/13/2017 1410   AMPHETMU NONE DETECTED 07/13/2017 1410   THCU NONE DETECTED 07/13/2017 1410   LABBARB POSITIVE (A) 07/13/2017 1410    Alcohol Level     Component Value Date/Time   ETH <10 07/13/2017 1117    IMAGING  Dg Chest 2 View 07/13/2017 Stable cardiomegaly.  No acute abnormality seen.  Mr Brain Wo Contrast 07/13/2017 1. CT finding earlier today corresponds to an acute to subacute mostly white matter infarct in the posterior right MCA territory. This is near the area affected by the  lacunar infarct in March this year. No associated hemorrhage or mass effect. 2. 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 Outpatient ENT consultation recommended if not already done. 3. 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  CT Head Wo Contrast 07/13/2017 Mild diffuse cortical atrophy. New focal low density seen in right periventricular white matter concerning for infarction of indeterminate age; MRI is recommended for further evaluation.   PHYSICAL EXAM  Temp:  [97.5 F (36.4 C)-98.7 F (37.1 C)] 98.3 F (36.8 C) (10/16 1252) Pulse Rate:  [67-78] 74 (10/16 1252) Resp:  [18] 18 (10/16 1252) BP: (123-143)/(39-63) 140/46 (10/16 1252) SpO2:  [96 %-100 %] 100 % (10/16 1252)  General - Frail appearing elderly woman in no distress, eating breakfast  Mental Status -  Level of arousal was alert. Language including expression, naming, repetition, comprehension was assessed and intact with some slowing in verbal responses Attention span and concentration were normal.  Cranial Nerves II - XII - II - Visual field intact OU. III, IV, VI - Extraocular movements intact. V - Facial sensation intact bilaterally. VII - Facial movement intact bilaterally. VIII - Hearing & vestibular intact bilaterally. X - Palate elevates symmetrically. XI - Chin turning & shoulder shrug intact bilaterally. XII - Tongue protrusion intact.  Motor Strength - The patient's strength was at least 4/5 in upper extremities, unable to assess lower extremities due to severe discomfort to examination  Motor Tone - Muscle tone was was normal.  Reflexes - Lower extremity reflex testing limited by severe sensitivity to even touch  Sensory - Light touch sensation intact bilaterally, severe pain out of proportion to stimulus in lower extremities  Coordination - Mouth and bilateral hand positional tremors were present with distraction and intentional  movement  Gait and Station - Not assessed   ASSESSMENT/PLAN Ms. Dana Harrington is a 76 y.o. female with history of seizure disorder, CVA with residual L sided weakness, hypertension presenting with acute onset of aphasia and right weakness with facial drooping.   Probably silent subacute stroke with recurrent seizure activity on AEDs. Radiographic findings of R sided lesion are not consistent with presenting deficit. EEG activity is more consistent but also contralateral to infarct region.  Resultant  None apparent  CT head New focal low density seen in right periventricular white matter concerning for infarction of indeterminate age  MRI head Acute to subacute mostly white matter infarct in the posterior right MCA territory. This is near the area affected by the lacunar infarct in March this year. No associated hemorrhage or mass effect. 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  MRA head pending  Carotid Doppler  Not ordered  2D Echo  Report pending  EEG  Mild to moderate diffuse background slowing with occasional triphasic waves, Occasional left frontocentral epileptiform discharges, Rare generalized spike and wave discharges with frontal predominance  LDL 66  HgbA1c 5.2%  Enoxparin for VTE prophylaxis DIET DYS 2 Room service appropriate? Yes; Fluid consistency: Thin  aspirin 325 mg daily and clopidogrel 75 mg daily prior to admission, now on aspirin 325 mg daily and clopidogrel 75 mg daily  Patient counseled to be compliant with her antithrombotic medications  Ongoing aggressive stroke risk factor management  Therapy recommendations:  PT/OT SNF 24hr supervision  Disposition:  Pending  Hypertension  Stable Permissive hypertension not needed with subacute imaging presentation Long-term BP goal normotensive <130/90  Hyperlipidemia  Home meds:  Pravastatin 40mg , resumed  in hospital  LDL 66, goal < 70  Continue statin at discharge  Diabetes  HgbA1c 5.2%, goal < 7.0  Other Stroke Risk Factors  Advanced age  Hx stroke  UTI On ceftriaxone for large urine leukocytosis seen 10/14. Urine Cx growing multiple species  Seizure Disorder  On depakote 500mg  qAM 1000mg  qPM, Zonegran 100mg  BID  Depakote level subtherapeutic at 38, increased dose to 1000mg  BID inpatient  Per her daughter previously on higher doses of medication but decreased because of supertherapeutic levels of VPA  Abnormal EEG and may explain presenting symptoms and could be triggered due to new subacute infarct, subtherapeutic range AEDs, UTI   Hospital day # 1  I have personally examined this patient, reviewed notes, independently viewed imaging studies, participated in medical decision making and plan of care.ROS completed by me personally and pertinent positives fully documented  I have made any additions or clarifications directly to the above note. The patient`s history of presentation is not very clear and suggestive of a new stroke or seizure and could be encephalopathy related to UTI but MRI scan shows possibly a small subacute extension often chronic right brain infarct and EEG shows focal brief epileptiform activity arising from the left frontocentral regions and occasional generalized spikes. Recommend optimizing anticonvulsant medications and he consider increasing Depakote dose to 1 gm twice daily and continue Zonegran in the current dose. Patient's daughter needs to be involved in these medication change decisions as apparently she has had extreme swings in her valproic acid levels in the past. Discussed with Dr. Lucianne Lei and answered questions. Greater than 50% time during this 35 minute visit was spent on counseling and coordination of care about her seizures, strokes discussion about anticonvulsants and answering questions  Antony Contras, MD Medical Director [place] Pager: [phone] 07/15/2017 3:40 PM   To contact Stroke Continuity provider, please refer to [email or web address] After hours, contact General Neurology

## 2017-07-15 NOTE — Progress Notes (Signed)
Paged MD regarding if he wanted to ordered anything so that patient could have MRI completely. Was under impression MRI was done earlier today per phone call from MRI but received call at [phone] that still needed and could not complete because agitated. MD paged to oncoming nurse phone

## 2017-07-16 DIAGNOSIS — G40909 Epilepsy, unspecified, not intractable, without status epilepticus: Secondary | ICD-10-CM

## 2017-07-16 DIAGNOSIS — I10 Essential (primary) hypertension: Secondary | ICD-10-CM

## 2017-07-16 MED ORDER — DIVALPROEX SODIUM 500 MG PO DR TAB: 1000.0000 mg | DELAYED_RELEASE_TABLET | Freq: Two times a day (BID) | ORAL | 0 refills | Status: AC

## 2017-07-16 NOTE — Progress Notes (Signed)
STROKE TEAM PROGRESS NOTE   HISTORY OF PRESENT ILLNESS (per record) Dana Harrington is an 76 y.o. female who has a history of seizure ever since very early childhood. Per the daughter the majority of her seizures are tonic-clonic. She currently is on a Zonegran 100 mg twice a day and Depakote 500 mg Depakote in the morning and 1000 night. She has not had a seizure for quite a while.  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 She is a illiterate but able to count fingers, state colors and follow commands.  She does not know the month, date, year she would not be able to baseline.  Per the daughter who was not at the scene, her husnband was feeding her when she was unable to take her pills and then appeared to have a right facial droop but the way she describes is that the left face drew sideways in a downward position and she was unable to get her words out. She was then noticed the weaker on the right side. For that reason she was taken to the emergency department.  MRI was obtained and did show a DWI abnormality corresponding to a subacute to chronic area of infarction in the right cerebral hemisphere - which upon discussion with the neuroradiologist, seems to have some amount of isointense ADC and rest of it is now progressing towards T2 shine through.  Daughter does state that during the last stroke she did have what she believes was a Holter monitor. She states this did not show any abnormality (we do not have the reports). Patient currently is hooked up to telemetry. No witnessed seizure this time but prior strokes were diagnosed after she had seizures and was taken in for evaluation of seizures. Patient has been on DAPT since August.  One of the questions for neurology is what preventative therapy should the patient be on for long term?  Date last known well: Date: 07/13/2017 Time last known well: Unable to  determine tPA Given: No: out of window NIHSS-12 Modified Rankin: Rankin Score=4  SUBJECTIVE (INTERVAL HISTORY) Her daughter is at bedside. Note patient's history of presentation with her and looks like she likely had an unwitnessed seizure and was discovered by family to have facial drool on the left with thickening of tongue. Her valproic acid level was subtherapeutic and daughter is okay with increasing the dose. OBJECTIVE  CBC:   Recent Labs Lab 07/13/17 1117 07/13/17 1133 07/15/17 0621  WBC 9.0  --  9.4  NEUTROABS 4.5  --   --   HGB 14.1 15.0 13.0  HCT 44.4 44.0 40.6  MCV 95.1  --  94.9  PLT 110*  --  115*    Basic Metabolic Panel:   Recent Labs Lab 07/13/17 1117 07/13/17 1133 07/15/17 0621  NA 139 143 140  K 4.0 4.0 3.7  CL 110 110 108  CO2 24  --  23  GLUCOSE 90 82 89  BUN 27* 29* 19  CREATININE 1.17* 1.10* 0.89  CALCIUM 9.3  --  8.9    Lipid Panel:     Component Value Date/Time   CHOL 135 07/14/2017 0459   TRIG 207 (H) 07/14/2017 0459   HDL 28 (L) 07/14/2017 0459   CHOLHDL 4.8 07/14/2017 0459   VLDL 41 (H) 07/14/2017 0459   LDLCALC 66 07/14/2017 0459   HgbA1c:  Lab Results  Component Value Date  HGBA1C 5.2 07/14/2017   Urine Drug Screen:     Component Value Date/Time   LABOPIA POSITIVE (A) 07/13/2017 1410   COCAINSCRNUR NONE DETECTED 07/13/2017 1410   LABBENZ POSITIVE (A) 07/13/2017 1410   AMPHETMU NONE DETECTED 07/13/2017 1410   THCU NONE DETECTED 07/13/2017 1410   LABBARB POSITIVE (A) 07/13/2017 1410    Alcohol Level     Component Value Date/Time   ETH <10 07/13/2017 1117    IMAGING  Dg Chest 2 View 07/13/2017 Stable cardiomegaly.  No acute abnormality seen.  Mr Brain Wo Contrast 07/13/2017 1. CT finding earlier today corresponds to an acute to subacute mostly white matter infarct in the posterior right MCA territory. This is near the area affected by the lacunar infarct in March this year. No associated hemorrhage or mass  effect. 2. 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 Outpatient ENT consultation recommended if not already done. 3. 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  CT Head Wo Contrast 07/13/2017 Mild diffuse cortical atrophy. New focal low density seen in right periventricular white matter concerning for infarction of indeterminate age; MRI is recommended for further evaluation.   PHYSICAL EXAM  Temp:  [97.7 F (36.5 C)-98.5 F (36.9 C)] 98.2 F (36.8 C) (10/17 1210) Pulse Rate:  [66-92] 82 (10/17 1210) Resp:  [16-17] 16 (10/17 1210) BP: (140-158)/(38-59) 148/44 (10/17 1210) SpO2:  [95 %-97 %] 97 % (10/17 1210)  General - Frail appearing elderly woman in no distress, eating breakfast  Mental Status -  Level of arousal was alert. Language including expression, naming, repetition, comprehension was assessed and intact with some slowing in verbal responses Attention span and concentration were normal.  Cranial Nerves II - XII - II - Visual field intact OU. III, IV, VI - Extraocular movements intact. V - Facial sensation intact bilaterally. VII - Facial movement intact bilaterally. VIII - Hearing & vestibular intact bilaterally. X - Palate elevates symmetrically. XI - Chin turning & shoulder shrug intact bilaterally. XII - Tongue protrusion intact.  Motor Strength - The patient's strength was at least 4/5 in upper extremities, unable to assess lower extremities due to severe discomfort to examination  Motor Tone - Muscle tone was was normal.  Reflexes - Lower extremity reflex testing limited by severe sensitivity to even touch  Sensory - Light touch sensation intact bilaterally, severe pain out of proportion to stimulus in lower extremities  Coordination - Mouth and bilateral hand positional tremors were present with distraction and intentional movement  Gait and Station - Not assessed   ASSESSMENT/PLAN Ms. Dana Harrington is a 76 y.o. female with history of seizure disorder, CVA with residual L sided weakness, hypertension presenting with acute onset of aphasia and right weakness with facial drooping.   Probably silent subacute stroke with recurrent seizure activity on AEDs. Radiographic findings of R sided lesion are not consistent with presenting deficit. EEG activity is more consistent but also contralateral to infarct region.  Resultant  None apparent  CT head New focal low density seen in right periventricular white matter concerning for infarction of indeterminate age  MRI head Acute to subacute mostly white matter infarct in the posterior right MCA territory. This is near the area affected by the lacunar infarct in March this year. No associated hemorrhage or mass effect. 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  MRA head pending  Carotid  Doppler  Not ordered 2D Echo  Left ventricle: The cavity size was normal. Wall thickness was   increased in a pattern of mild LVH. Systolic function was   vigorous. The estimated ejection fraction was in the range of 65%   to 70%. Wall motion was normal; there were no regional wall    motion abnormalities.   EEG  Mild to moderate diffuse background slowing with occasional triphasic waves, Occasional left frontocentral epileptiform discharges, Rare generalized spike and wave discharges with frontal predominance  LDL 66  HgbA1c 5.2%  Enoxparin for VTE prophylaxis DIET DYS 2 Room service appropriate? Yes; Fluid consistency: Thin  aspirin 325 mg daily and clopidogrel 75 mg daily prior to admission, now on aspirin 325 mg daily and clopidogrel 75 mg daily  Patient counseled to be compliant with her antithrombotic medications  Ongoing aggressive stroke risk factor management  Therapy recommendations:  PT/OT SNF 24hr supervision  Disposition:   Pending  Hypertension  Stable Permissive hypertension not needed with subacute imaging presentation Long-term BP goal normotensive <130/90  Hyperlipidemia  Home meds:  Pravastatin 40mg , resumed in hospital  LDL 66, goal < 70  Continue statin at discharge  Diabetes  HgbA1c 5.2%, goal < 7.0  Other Stroke Risk Factors  Advanced age  Hx stroke  UTI On ceftriaxone for large urine leukocytosis seen 10/14. Urine Cx growing multiple species  Seizure Disorder  On depakote 500mg  qAM 1000mg  qPM, Zonegran 100mg  BID  Depakote level subtherapeutic at 38, increased dose to 1000mg  BID inpatient  Per her daughter previously on higher doses of medication but decreased because of supertherapeutic levels of VPA  Abnormal EEG and may explain presenting symptoms and could be triggered due to new subacute infarct, subtherapeutic range AEDs, UTI   Hospital day # 2  I have personally examined this patient, reviewed notes, independently viewed imaging studies, participated in medical decision making and plan of care.ROS completed by me personally and pertinent positives fully documented  I have made any additions or clarifications directly to the above note. The patient`s history of presentation is not very clear and suggestive of a new stroke or seizure and could be encephalopathy related to UTI but MRI scan shows possibly a small subacute extension often chronic right brain infarct and EEG shows focal brief epileptiform activity arising from the left frontocentral regions and occasional generalized spikes. Recommend   increasing Depakote dose to 1 gm twice daily and continue Zonegran in the current dose. Marland Kitchen Discussed with Dr. Eliseo Squires and answered questions.follow-up as an outpatient with her neurologist Dr. Krista Blue for further adjustment of her seizure medications. Stroke team will sign off. Kindly call for questions. Greater than 50% time during this 25 minute visit was spent on counseling and  coordination of care about her seizures, strokes discussion about anticonvulsants and answering questions  Antony Contras, MD Medical Director [place] Pager: [phone] 07/16/2017 3:38 PM   To contact Stroke Continuity provider, please refer to [email or web address] After hours, contact General Neurology

## 2017-07-16 NOTE — Progress Notes (Signed)
Patient refused neuro assessment for midnight and this morning.

## 2017-07-16 NOTE — Discharge Summary (Addendum)
Physician Discharge Summary  Dana Harrington GYI:[id number] DOB: 02-Jun-1941 DOA: 07/13/2017  PCP: Dana Gravel, MD  Admit date: 07/13/2017 Discharge date: 07/16/2017   Recommendations for Outpatient Follow-Up:   1. Family declined SNF- have arranged home health 2. outpatient ENT referral for parotid mass 3. Consider palliative care referral 4. Depakote level in 1 week 5. Suspect episode was seizure related   Discharge Diagnosis:   Principal Problem:   Stroke-like symptoms Active Problems:   History of CVA (cerebrovascular accident)   Hypertension   Seizure disorder ([place])   CKD (chronic kidney disease), stage III (HCC)   Hypothyroidism, adult   Diastolic dysfunction   Pulmonary HTN (HCC)   Tricuspid regurgitation   Thrombocytopenia (HCC)   left Parotid mass   Discharge disposition:  Home.  Discharge Condition: Improved.  Diet recommendation: Low sodium, heart healthy  Wound care: None.   History of Present Illness:   Dana Harrington is a 76 y.o. female with medical history significant for initial diagnosis of epilepsy at 74 months of age and had been without seizures/quiescent until she had to episodes of seizures both in March 2018 and again in August 2018 in the context of acute CVA (now on AEDs by outpatient neurology), initial non-embolic CVA March 3500 with associated left-sided weakness. (discharged on aspirin). Recurrent stroke August 2018 and was admitted to [facility] in [place] with her presenting symptom being seizure activity. Stroke at that time was located in the right parietal lobe. He was discharged on aspirin with Plavix. Other medical history includes stage III chronic kidney disease diastolic dysfunction in the context of pulmonary hypertension and tricuspid regurgitation, hypertension, hypothyroidism and aortic stenosis.   Patient was in her usual state of health until early this morning. Occasionally she requires Robaxin for muscle  spasms and awakened at 3 AM to take a dose of Robaxin. She went back to bed and was awakened at 9 AM by her son who noticed she had slurred speech and appeared to be aphasic having difficulty speaking and forming words. She was also weak on her right side with right facial drooping. She was brought to the ER for further evaluation and treatment. Noncontrasted CT of the head performed in the ER revealed new focal low density area seen in the right periventricular white matter concerning for infarction of indeterminate age.. Neurology consulted and will see the patient formally. MRI of the brain recommended and patient has been taken to MR before could be examined by the internal medicine team. Of note previous strokes have only been detected on MRI. The patient finally returned from MR around 2:15 PM  In my discussion with the daughter and the patient the patient has had intermittent episodes of recurrent drooling since her last stroke. Daughter reports that after rehabilitation patient has been inconsistent with performing rehabilitative therapies including oral pharyngeal exercises recommended by SLP. Patient has chronic poor oral intake despite utilization of protein supplementation and appetite stimulants. Daughter reports urine has been concentrated in the past week. No fevers or chills. No other upper respiratory infection symptoms or gastrointestinal infection symptoms. Recent poor oral intake although does better when fed. Daughter reports that has declined physically since discharge from rehabilitation in August.    Hospital Course by Problem:   CVA: XFG:HWEXH to subacute mostly white matter infarct in the posterior right MCA territory. -MRA refused -Neurologyconsulted-patient with recurrent stroke despite dual antiplatelet therapy  -HgbA1cwas 5.6 in March -on Pravachol prior to admission -Underwent echocardiogram in March but  will repeat- repeat normal -Carotid duplex in March therefore no  utility in repeating  Hypertension -Current blood pressure well controlled but allowing for permissive hypertension  Seizure disorder  -Followed by Dr. 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 incidental finding on MRI brain -Radiologist recommends outpatient ENT evaluation    Medical Consultants:    Neuro   Discharge Exam:   Vitals:   07/16/17 0604 07/16/17 0757  BP: (!) 158/48 (!) 140/59  Pulse: 66 92  Resp: 17 16  Temp: 97.9 F (36.6 C) 98.5 F (36.9 C)  SpO2: 95% 96%   Vitals:   07/15/17 1252 07/15/17 2156 07/16/17 0604 07/16/17 0757  BP: (!) 140/46 (!) 152/38 (!) 158/48 (!) 140/59  Pulse: 74 73 66 92  Resp: 18 17 17 16   Temp: 98.3 F (36.8 C) 97.7 F (36.5 C) 97.9 F (36.6 C) 98.5 F (36.9 C)  TempSrc: Oral Oral Oral Oral  SpO2: 100% 95% 95% 96%  Weight:      Height:        Gen:  NAD- excited to go home    The results of significant diagnostics from this hospitalization (including imaging, microbiology, ancillary and laboratory) are listed below for reference.     Procedures and Diagnostic Studies:   Dg Chest 2 View  Result Date: 07/13/2017 CLINICAL DATA:  Stroke-like symptoms EXAM: CHEST  2 VIEW COMPARISON:  12/19/2016 FINDINGS: Cardiac shadow is enlarged. Aortic calcifications are again seen. The lungs are well aerated bilaterally. No focal infiltrate or sizable effusion is seen. No acute bony  abnormality is noted. IMPRESSION: Stable cardiomegaly.  No acute abnormality seen. Electronically Signed   By: Inez Catalina M.D.   On: 07/13/2017 19:11   Ct Head Wo Contrast  Result Date: 07/13/2017 CLINICAL DATA:  Slurred speech, facial droop. EXAM: CT HEAD WITHOUT CONTRAST TECHNIQUE: Contiguous axial images were obtained from the base of the skull through the vertex without intravenous contrast. COMPARISON:  CT scan of December 19, 2016. FINDINGS: Brain: Mild diffuse cortical atrophy is noted. No mass effect or midline shift is noted. Ventricular size is within normal limits. There is no evidence of mass lesion or hemorrhage. New focal low density is noted in right periventricular white matter concerning for infarction of indeterminate age. Vascular: No hyperdense vessel or unexpected calcification. Skull: Normal. Negative for fracture or focal lesion. Sinuses/Orbits: No acute finding. Other: None. IMPRESSION: Mild diffuse cortical atrophy. New focal low density seen in right periventricular white matter concerning for infarction of indeterminate age; MRI is recommended for further evaluation. Electronically Signed   By: Marijo Conception, M.D.   On: 07/13/2017 12:03   Mr Jeri Cos JS Contrast  Result Date: 07/13/2017 CLINICAL DATA:  76 year old female with facial droop and slurred speech. Age indeterminate right hemisphere hypodensity on head CT today. EXAM: MRI HEAD WITHOUT AND WITH CONTRAST TECHNIQUE: Multiplanar, multiecho pulse sequences of the brain and surrounding structures were obtained without and with intravenous contrast. CONTRAST:  42mL MULTIHANCE GADOBENATE DIMEGLUMINE 529 MG/ML IV SOLN COMPARISON:  Head CT 1140 hours  today.  Brain MRI 12/19/2016 FINDINGS: Brain: 2 cm area of abnormal trace diffusion along the posterior right corona radiata and subcortical white matter tracking toward the posterior right operculum (series 5, image 15. This appears partially restricted (series 550, image 15) with  well developed T2 and FLAIR hyperintensity. This lesion is not far from the smaller posterior corona radiata white matter infarct which occurred in March. Only mild associated T1 hypointensity. No associated hemorrhage or mass effect. No other restricted diffusion. No midline shift, mass effect, evidence of mass lesion, ventriculomegaly, extra-axial collection or acute intracranial hemorrhage. Cervicomedullary junction and pituitary are within normal limits. Chronic encephalomalacia in the left occipital pole is stable. Stable gray and white matter signal outside of the acute abnormality. Hyperostosis of the calvarium, no definite abnormal enhancement. Vascular: Major intracranial vascular flow voids appear stable since March. Skull and upper cervical spine: Calvarium hyperostosis. Stable bone marrow signal. Negative for age visible cervical spine. Sinuses/Orbits: Negative orbits soft tissues. Improved paranasal sinus aeration since March. Other: Visible internal auditory structures appear normal. Mastoid air cells remain clear. 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 IMPRESSION: 1. CT finding earlier today corresponds to an acute to subacute mostly white matter infarct in the posterior right MCA territory. This is near the area affected by the lacunar infarct in March this year. No associated hemorrhage or mass effect. 2. 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 Outpatient ENT consultation recommended if not already done. 3. 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 Electronically Signed   By: Genevie Ann M.D.   On: 07/13/2017 14:18     Labs:   Basic Metabolic Panel:  Recent Labs Lab 07/13/17 1117 07/13/17 1133 07/15/17 0621  NA 139 143 140  K 4.0 4.0 3.7  CL 110 110 108  CO2 24  --  23  GLUCOSE 90 82 89  BUN 27* 29* 19  CREATININE 1.17* 1.10* 0.89  CALCIUM 9.3  --  8.9   GFR Estimated  Creatinine Clearance: 46 mL/min (by C-G formula based on SCr of 0.89 mg/dL). Liver Function Tests:  Recent Labs Lab 07/13/17 1117  AST 27  ALT 12*  ALKPHOS 65  BILITOT 0.8  PROT 7.0  ALBUMIN 2.7*   No results for input(s): LIPASE, AMYLASE in the last 168 hours. No results for input(s): AMMONIA in the last 168 hours. Coagulation profile  Recent Labs Lab 07/13/17 1117  INR 1.04    CBC:  Recent Labs Lab 07/13/17 1117 07/13/17 1133 07/15/17 0621  WBC 9.0  --  9.4  NEUTROABS 4.5  --   --   HGB 14.1 15.0 13.0  HCT 44.4 44.0 40.6  MCV 95.1  --  94.9  PLT 110*  --  115*   Cardiac Enzymes: No results for input(s): CKTOTAL, CKMB, CKMBINDEX, TROPONINI in the last 168 hours. BNP: Invalid input(s): POCBNP CBG:  Recent Labs Lab 07/15/17 1618  GLUCAP 86   D-Dimer No results for input(s): DDIMER in the last 72 hours. Hgb A1c  Recent Labs  07/14/17 0459  HGBA1C 5.2   Lipid Profile  Recent Labs  07/14/17 0459  CHOL 135  HDL 28*  LDLCALC 66  TRIG 207*  CHOLHDL 4.8   Thyroid function studies No results for input(s): TSH, T4TOTAL, T3FREE, THYROIDAB in the last 72 hours.  Invalid input(s): FREET3 Anemia work up No results for input(s): VITAMINB12, FOLATE, FERRITIN, TIBC, IRON, RETICCTPCT in the  last 72 hours. Microbiology Recent Results (from the past 240 hour(s))  Culture, Urine     Status: Abnormal   Collection Time: 07/13/17  2:10 PM  Result Value Ref Range Status   Specimen Description URINE, CLEAN CATCH  Final   Special Requests NONE  Final   Culture MULTIPLE SPECIES PRESENT, SUGGEST RECOLLECTION (A)  Final   Report Status 07/14/2017 FINAL  Final     Discharge Instructions:   Discharge Instructions    Discharge instructions    Complete by:  As directed    DYS 2- thin liquids Home health   Increase activity slowly    Complete by:  As directed      Allergies as of 07/16/2017      Reactions   Gabapentin Other (See Comments)   "Bouncing"     Penicillins Rash   Has patient had a PCN reaction causing immediate rash, facial/tongue/throat swelling, SOB or lightheadedness with hypotension: rash Has patient had a PCN reaction causing severe rash involving mucus membranes or skin necrosis: yes Has patient had a PCN reaction that required hospitalization: unk Has patient had a PCN reaction occurring within the last 10 years: no If all of the above answers are "NO", then may proceed with Cephalosporin use.      Medication List    STOP taking these medications   Butalbital-APAP-Caffeine 50-300-40 MG Caps Commonly known as:  FIORICET   loratadine-pseudoephedrine 10-240 MG 24 hr tablet Commonly known as:  CLARITIN-D 24-hour   methocarbamol 500 MG tablet Commonly known as:  ROBAXIN     TAKE these medications   acetaminophen 325 MG tablet Commonly known as:  TYLENOL Take 325 mg by mouth every 6 (six) hours as needed for pain.   ALPRAZolam 0.5 MG tablet Commonly known as:  XANAX Take 0.5 mg by mouth daily as needed for anxiety.   aspirin EC 325 MG tablet Take 1 tablet (325 mg total) by mouth daily.   carvedilol 12.5 MG tablet Commonly known as:  COREG Take 12.5 mg by mouth 2 (two) times daily with a meal.   clopidogrel 75 MG tablet Commonly known as:  PLAVIX Take 1 tablet (75 mg total) by mouth daily.   divalproex 500 MG DR tablet Commonly known as:  DEPAKOTE Take 2 tablets (1,000 mg total) by mouth every 12 (twelve) hours. What changed:  how much to take  how to take this  when to take this  additional instructions   febuxostat 40 MG tablet Commonly known as:  ULORIC Take 40 mg by mouth daily.   levothyroxine 100 MCG tablet Commonly known as:  SYNTHROID, LEVOTHROID Take 100 mcg by mouth daily before breakfast.   omeprazole 40 MG capsule Commonly known as:  PRILOSEC Take 1 capsule by mouth 2 (two) times daily.   oxyCODONE-acetaminophen 5-325 MG tablet Commonly known as:  PERCOCET/ROXICET TAKE 1  TABLET BY MOUTH EVERY 6 HOURS AS NEEDED FOR PAIN   pravastatin 40 MG tablet Commonly known as:  PRAVACHOL Take 40 mg by mouth daily.   Vitamin D (Ergocalciferol) 50000 units Caps capsule Commonly known as:  DRISDOL Take 50,000 Units by mouth once a week.   zonisamide 100 MG capsule Commonly known as:  ZONEGRAN Take 1 capsule (100 mg total) by mouth 2 (two) times daily.      Follow-up Information    Dana Gravel, MD Follow up in 1 week(s).   Specialty:  Internal Medicine Contact information: [address] [place] [address] [phone]  Marcial Pacas, MD Follow up.   Specialty:  Neurology Contact information: [place] [address] [phone]            Time coordinating discharge: 35 min  Signed:  Enio Hornback U Shanaiya Bene   [facility] 07/16/2017, 9:10 AM

## 2017-07-16 NOTE — Progress Notes (Signed)
Patient refuses to take night medicine, NIHS assessment, and ct scan.

## 2017-07-16 NOTE — Progress Notes (Signed)
CSW received consult regarding PT recommendation of SNF at discharge.  Patient's daughter is refusing SNF. She was considering [facility], but has decided that it would be betterif her mom went home with home health. RNCM aware.   CSW signing off.   Percell Locus Eladio [place] LCSWA [phone]

## 2017-07-16 NOTE — Progress Notes (Signed)
Physical Therapy Treatment Patient Details Name: Dana Harrington MRN: [id number] DOB: Nov 01, 1940 Today's Date: 07/16/2017    History of Present Illness   76 y.o. female with medical history significant for initial diagnosis of epilepsy at 52 months of age and had been without seizures/quiescent until she had to episodes of seizures both in March 2018 and again in August 2018 in the context of acute CVA (now on AEDs by outpatient neurology), initial non-embolic CVA March 5176 with associated left-sided weakness. 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    PT Comments    Pt continues to require extensive assistance for all aspects of functional mobility at this time. PT reinforced strong recommendation for SNF at d/c. Daughter present in the room throughout session and states SNF is "out of the question", feeling her mother did not get the therapy she needed there. The daughter also reports that she and her husband have been managing at home when the patient was able to assist with scooting transfers. Pt is currently not at a level where she can participate with OOB transfers, and required up to max assist for sitting balance at EOB only. Discussed the option of lift equipment at home to assist, however daughter declined this as well, stating her husband "carries" the patient when needed. Strongly feel that it is in the patient's best interest to return to SNF or have 24 hour assistance at home by skilled caregivers with home health PT/OT to follow. The daughter was educated in positioning the weaker L side to manage edema and minimize contractures, and also positioning to decrease risk of skin breakdown due to immobility. Apparently there is a pressure relief cushion available at home that the family has been placing in a transfer chair for her when [place]. Discussed with the case manager the need for ambulance transfer home, and recommendation for [facility] lift if family should  change their minds. Will continue to follow.   Follow Up Recommendations  SNF;Supervision/Assistance - 24 hour     Equipment Recommendations  None recommended by PT    Recommendations for Other Services       Precautions / Restrictions Precautions Precautions: Fall Restrictions Weight Bearing Restrictions: No    Mobility  Bed Mobility Overal bed mobility: Needs Assistance Bed Mobility: Supine to Sit     Supine to sit: HOB elevated;Max assist;Total assist     General bed mobility comments: Max assist for all aspects of bed mobility. Pt was able to reach for the R railing with R hand but pt was able to provide little to no effort to assist with transition to/from EOB. Bed pad utilized for scooting, and +2 required for scoot towards HOB at end of session.   Transfers                 General transfer comment: OOB transfer deferred at this time as it was not safe to attempt without +2 assistance. Daughter willing to help however PT declined - unsafe without another staff member present.   Ambulation/Gait             General Gait Details: Unable   Stairs            Wheelchair Mobility    Modified Rankin (Stroke Patients Only)       Balance Overall balance assessment: Needs assistance Sitting-balance support: Feet supported;Single extremity supported Sitting balance-Leahy Scale: Zero Sitting balance - Comments: Up to max assist provided at all times to maintain sitting balance.  Postural control: Right lateral lean;Posterior lean                                  Cognition Arousal/Alertness: Awake/alert Behavior During Therapy: Anxious Overall Cognitive Status: History of cognitive impairments - at baseline                                 General Comments: Pt anxious to move and required cues throughout to increase communication and follow commands      Exercises      General Comments General comments (skin  integrity, edema, etc.): Daughter present throughout session.       Pertinent Vitals/Pain Pain Assessment: Faces Faces Pain Scale: Hurts even more Pain Location: RLE with movement Pain Descriptors / Indicators: Grimacing;Guarding Pain Intervention(s): Limited activity within patient's tolerance;Monitored during session    Home Living                      Prior Function            PT Goals (current goals can now be found in the care plan section) Acute Rehab PT Goals Patient Stated Goal: Pt did not state goals - family wants to take pt home instead of going to SNF PT Goal Formulation: With patient/family Time For Goal Achievement: 07/28/17 Potential to Achieve Goals: Fair Progress towards PT goals: Not progressing toward goals - comment    Frequency    Min 2X/week      PT Plan Current plan remains appropriate    Co-evaluation              AM-PAC PT "6 Clicks" Daily Activity  Outcome Measure  Difficulty turning over in bed (including adjusting bedclothes, sheets and blankets)?: Unable Difficulty moving from lying on back to sitting on the side of the bed? : Unable Difficulty sitting down on and standing up from a chair with arms (e.g., wheelchair, bedside commode, etc,.)?: Unable Help needed moving to and from a bed to chair (including a wheelchair)?: Total Help needed walking in hospital room?: Total Help needed climbing 3-5 steps with a railing? : Total 6 Click Score: 6    End of Session   Activity Tolerance: Patient limited by pain Patient left: in chair;with call bell/phone within reach;with chair alarm set;with family/visitor present Nurse Communication: Mobility status PT Visit Diagnosis: Muscle weakness (generalized) (M62.81);Pain Pain - Right/Left: Left Pain - part of body: Leg (BLE's)     Time: 8127-5170 PT Time Calculation (min) (ACUTE ONLY): 32 min  Charges:  $Therapeutic Activity: 23-37 mins                    G Codes:        Rolinda Roan, PT, DPT Acute Rehabilitation Services Pager: [phone]    Thelma Comp 07/16/2017, 2:03 PM

## 2017-07-16 NOTE — Progress Notes (Addendum)
[facility] called per CM for Ppt's transportation to home. Address confirmed with daughter, Amya. Whitman Hero RN,BSN,CM

## 2017-07-16 NOTE — Care Management Note (Addendum)
Case Management Note  Patient Details  Name: Dana Harrington MRN: [id number] Date of Birth: Jul 29, 1941  Subjective/Objective:           Presented with stroke like symptoms, hx of epilepsy at 62 months of age and had been without seizures/quiescent until she had  episodes of seizures both in March 2018 and again in August 2018. Resides with daughter, Amarisa.   Antonietta Jewel (Daughter) Arthur Holms ([phone]      PCP: Jani Gravel  Action/Plan: Plan is to d/c today,waiting PT evaluation for home assessment. CM to call and arrange transportation to home with [facility] when pt ready.  Expected Discharge Date:  07/16/17               Expected Discharge Plan:     In-House Referral:   CSW, refused SNF placement  Discharge planning Services     Post Acute Care Choice:    Choice offered to:     DME Arranged:    DME Agency:     HH Arranged:   PT,OT,Nurse Aide HH Agency:    [facility] Status of Service:     If discussed at [place] of Stay Meetings, dates discussed:    Additional Comments:  Sharin Mons, RN 07/16/2017, 11:09 AM

## 2017-07-17 NOTE — Consult Note (Signed)
           [facility] CM Primary Care Navigator  07/17/2017  Dana Harrington 10-07-1940 [id number]   Wentto seepatient at the bedsideto identify possible discharge needs but she was alreadydischargedper staff report.  Patient was discharged home yesterday with [facility] health services ([facility] since SNF (skilled nursing facility) was refused per Inpatient CM note.  Primary care provider's officeis listed as doing transition of care (TOC).  Patient has a discharge instruction to follow-up with primary care provider withina week.  Noted order for EMMI Stroke calls to follow-up recovery at home already in place.   For questions, please contact:  Dannielle Huh, BSN, RN- [facility] Primary Care Navigator  Telephone: [phone] [place]

## 2017-07-18 ENCOUNTER — Encounter: Payer: Self-pay | Admitting: Neurology

## 2017-07-18 ENCOUNTER — Telehealth: Payer: Medicare Other | Admitting: Nurse Practitioner

## 2017-07-18 DIAGNOSIS — G4489 Other headache syndrome: Secondary | ICD-10-CM

## 2017-07-18 NOTE — Progress Notes (Signed)
Based on what you shared with me it looks like you have a serious condition that should be evaluated in a face to face office visit.  NOTE: Even if you have entered your credit card information for this eVisit, you will not be charged.  Headache is not something we can treat in an evisit. If you are having a true medical emergency please call 911.  If you need an urgent face to face visit, [place] has four urgent care centers for your convenience.  If you need care fast and have a high deductible or no insurance consider:   [email or web address]  [phone]  [address], [address] [place], [place] [address] 8 am to 8 pm Monday-Friday 10 am to 4 pm Saturday-Sunday   The following sites will take your  insurance:    . [facility] [place] a Provider at this Location  [address] [place], [place] [address] . 10 am to 8 pm Monday-Friday . 12 pm to 8 pm Saturday-Sunday   . [facility] at [place] a Provider at this Location  [address], [place], [place] [address] . 8 am to 8 pm Monday-Friday . 9 am to 6 pm Saturday . 11 am to 6 pm Sunday   . [facility] at [place] Get Driving Directions  [address] [place], [place] [address] . 8 am to 8 pm Monday-Friday . 8 am to 4 pm Saturday-Sunday   Your e-visit answers were reviewed by a board certified advanced clinical practitioner to complete your personal care plan.  Thank you for using e-Visits.

## 2017-07-21 ENCOUNTER — Encounter: Payer: Self-pay | Admitting: Neurology

## 2017-07-21 ENCOUNTER — Ambulatory Visit (INDEPENDENT_AMBULATORY_CARE_PROVIDER_SITE_OTHER): Payer: Medicare Other | Admitting: Neurology

## 2017-07-21 VITALS — BP 150/80 | HR 73

## 2017-07-21 DIAGNOSIS — G40909 Epilepsy, unspecified, not intractable, without status epilepticus: Secondary | ICD-10-CM | POA: Diagnosis not present

## 2017-07-21 DIAGNOSIS — M79604 Pain in right leg: Secondary | ICD-10-CM | POA: Diagnosis not present

## 2017-07-21 DIAGNOSIS — M79605 Pain in left leg: Secondary | ICD-10-CM

## 2017-07-21 DIAGNOSIS — I63311 Cerebral infarction due to thrombosis of right middle cerebral artery: Secondary | ICD-10-CM | POA: Diagnosis not present

## 2017-07-21 NOTE — Progress Notes (Signed)
PATIENT: Dana Harrington DOB: 1941/01/18  Chief Complaint  Patient presents with  . Seizures    She is here with daughter, Dana Harrington and her son-in-law, Dana Harrington today.  Reports being diagnosed with Epilepsy at 72 months old.  Says her last seizure was more than 20 years ago.  She had a stroke in March 2018 and August 2018.  After the second stroke, her seizures started again.  She was taking Depakote 500mg , BID and it was increased 05/05/17 to 500mg  in am and 1000mg  in pm.  She has not had any further activity since her dose adjustment.  Marland Kitchen PCP    Dana Gravel, MD (referred from [facility])     [name] is a 76 year old female, accompanied by her daughter Dana Harrington and son-in-law Dana Harrington, seen in refer by her primary care doctor Dana Harrington, for evaluation of seizure, initial evaluation was on June 24 2017.  She had a history of aortic stenosis, hypertension, hypothyroidism,  Patient has a lifelong history of epilepsy, used to have generalized tonic-clonic seizure, was treated with Depakote 500 mg 2 tablets every night, Zonegran 100 mg twice a day,  She lives independently, but not driving, in March 0932, she suffered stroke, had sudden onset left-sided weakness, gait abnormality, this was [facility] at [facility].  MRI of the brain showed right basal ganglia lacunar infarction, low old left occipital infarction, atrophy, MRA of the brain was unremarkable, carotid artery ultrasound showed less than 39% stenosis, ejection fraction was 65-70%, A1c was 5.6, she was not on any antiplatelet agent at that time, was put on aspirin 325 mg daily,  After rehabilitation, she was discharged to her daughter's house, April 30 2017, she was noted to have seizure, eyes rolled back, right arm jerking, was [facility] the [facility], this is different from her usual generalized seizure per patient, before the seizure, last a seizure was a few years back, MRI of the brain  showed new stroke at the right parietal lobe.  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  EKG sinus rhythm  echocardiogram ejection 60-65% next graft A1c 5.2, LDL 42 triglyceride 147,  Because of recurrent stroke in March, again in August 2018, upon discharge summary, there was suggestion of 30 days cardiac monitoring.  UPDATE Jul 21 2017: She was admitted to the hospital on July 13 2017 for slurred speech, aphasic, difficulty speaking, forming words, increased weakness, MRI of the brain showed new stroke in the right periventricular white matter,  Cardiac monitoring July 13 2017 showed no significant abnormality. She continues to take aspirin and Plavix  She has no recurrent seizure, daughter noticed excessive drowsiness with higher dose of Depakote is now back to 500/1000 milligrams daily,  Zonegran 100 mg twice a day  Review of system: 14 system review was performed, pertinent as above as above   ALLERGIES: Allergies  Allergen Reactions  . Gabapentin Other (See Comments)    "Bouncing"   . Penicillins Rash    No current facility-administered medications for this visit.     PAST  MEDICAL HISTORY: Past Medical History:  Diagnosis Date  . Aortic stenosis   . Arthritis    "qwhere" (12/19/2016)  . Chronic lower back pain   . Complication of anesthesia    "she can't be in a deep anesthesia; she's sleeps long after she's suppose to wake up" (12/19/2016)  . GERD (gastroesophageal reflux disease)   . Gout   . Hypertension   . Hypothyroidism   . LFT elevation   . Paronychia of fourth toe of right foot   . Seizure disorder ([place])    since age 19  . Seizures ([place])    "epileptic; grand mal & pen; hasn't had one in years; on RX twice/day" (12/19/2016)  . Stroke ([place]) 12/19/2016   left arm and leg weakness, facial droop, slurred speech/notes 12/19/2016  . Stroke ([place])    04/2017  . Vitamin D deficiency   . Zoster 02/2009    PAST SURGICAL HISTORY: Past Surgical History:  Procedure Laterality Date  . LAPAROSCOPIC CHOLECYSTECTOMY  11/2003  . TUBAL LIGATION  1970    FAMILY HISTORY: Family History  Problem Relation Age of Onset  . Heart disease Father   . Heart attack Father        Age 22  . Dementia Mother        Age 42  . Stroke Mother   . Seizures Brother   . Sleep disorder Sister     SOCIAL HISTORY:  Social History   Social History  . Marital status: Widowed    Spouse name: N/A  . Number of children: 4  . Years of education: Did not attend school - even as a child.   Occupational History  . Retired    Social History Main Topics  . Smoking status: Never Smoker  . Smokeless tobacco: Former Systems developer     Comment: "used snuff when super super young"  . Alcohol use No  . Drug use: No  . Sexual activity: Not on file   Other Topics Concern  . Not on file   Social History Narrative   Lives at home with daughter and son-in-law.   Right-handed.   8oz Pepsi per day.     PHYSICAL EXAM   Vitals:   06/24/17 1417  BP: (!) 155/66  Pulse: 63    Not recorded      There is no height or weight on file to calculate BMI.  PHYSICAL EXAMNIATION:  Gen:  NAD, conversant, well nourised, obese, well groomed                     Cardiovascular: Regular rate rhythm, no peripheral edema, warm, nontender. Eyes: Conjunctivae clear without exudates or hemorrhage Neck: Supple, no carotid bruits. Pulmonary: Clear to auscultation bilaterally   NEUROLOGICAL EXAM:  MENTAL STATUS: Speech/Cognition: She has slurred speech, whining, seems to complain of bilateral lower extremity pain   CRANIAL NERVES: CN II: Visual fields are full to confrontation.   Pupils are round equal and briskly reactive to light. CN III, IV, VI: extraocular movement are normal. No ptosis. CN V: Facial sensation is intact to pinprick in all 3 divisions bilaterally. Corneal responses are intact.  CN VII: Face is symmetric with normal eye closure and smile. CN VIII: Hearing is normal to rubbing fingers CN IX, X: Palate elevates symmetrically. Phonation is normal. CN XI: Head turning and shoulder shrug are intact CN XII: Tongue is midline with normal movements and no atrophy.  MOTOR: Spastic left hemiparesis, decreased spontaneous movement of left arm and left leg, but still has antigravity movement,  REFLEXES: Reflexes are 2+ and symmetric at the biceps, triceps, knees, and ankles. Plantar responses are flexor.  SENSORY: Intact to light touch, pinprick, positional sensation and vibratory sensation are intact in fingers and toes.  COORDINATION: Rapid alternating movements and fine finger movements are intact. There is no dysmetria on finger-to-nose and heel-knee-shin.    GAIT/STANCE: Needed 2 people assistant to get up from seated position, leaning backwards, difficult to initiate gait,  DIAGNOSTIC DATA (LABS, IMAGING, TESTING) - I reviewed patient records, labs, notes, testing and imaging myself where available.   ASSESSMENT AND PLAN  KEYUNNA COCO is a 76 y.o. female   Recurrent stroke involving different vascular territory,  Suggestive of embolic event,    Continue aspirin, Plavix  Long history of epilepsy  Stable on current dose of Depakote DR 500 mg/1000 mg, zonogram 100 twice a day  Bilateral lower extremity pain  X-ray of pelvic, ultrasound of bilateral lower extremity rule out DVT  Marcial Pacas, M.D. Ph.D.  [facility] [address] [place], [place] [address] Ph: [phone] Fax: [phone]  CC: Dana Gravel, MD

## 2017-07-24 ENCOUNTER — Ambulatory Visit: Payer: Self-pay | Admitting: Neurology

## 2017-07-25 DIAGNOSIS — I69328 Other speech and language deficits following cerebral infarction: Secondary | ICD-10-CM | POA: Diagnosis not present

## 2017-07-25 DIAGNOSIS — I6990 Unspecified sequelae of unspecified cerebrovascular disease: Secondary | ICD-10-CM | POA: Diagnosis not present

## 2017-07-25 DIAGNOSIS — R2689 Other abnormalities of gait and mobility: Secondary | ICD-10-CM | POA: Diagnosis not present

## 2017-07-25 DIAGNOSIS — L89620 Pressure ulcer of left heel, unstageable: Secondary | ICD-10-CM | POA: Diagnosis not present

## 2017-07-25 DIAGNOSIS — D696 Thrombocytopenia, unspecified: Secondary | ICD-10-CM | POA: Diagnosis not present

## 2017-07-25 DIAGNOSIS — R05 Cough: Secondary | ICD-10-CM | POA: Diagnosis not present

## 2017-07-25 DIAGNOSIS — Z7982 Long term (current) use of aspirin: Secondary | ICD-10-CM | POA: Diagnosis not present

## 2017-07-25 DIAGNOSIS — I69398 Other sequelae of cerebral infarction: Secondary | ICD-10-CM | POA: Diagnosis not present

## 2017-07-25 DIAGNOSIS — N183 Chronic kidney disease, stage 3 (moderate): Secondary | ICD-10-CM | POA: Diagnosis not present

## 2017-07-25 DIAGNOSIS — I69391 Dysphagia following cerebral infarction: Secondary | ICD-10-CM | POA: Diagnosis not present

## 2017-07-25 DIAGNOSIS — R400 Somnolence: Secondary | ICD-10-CM | POA: Diagnosis not present

## 2017-07-25 DIAGNOSIS — I69320 Aphasia following cerebral infarction: Secondary | ICD-10-CM | POA: Diagnosis not present

## 2017-07-25 DIAGNOSIS — R4583 Excessive crying of child, adolescent or adult: Secondary | ICD-10-CM | POA: Diagnosis not present

## 2017-07-25 DIAGNOSIS — L89153 Pressure ulcer of sacral region, stage 3: Secondary | ICD-10-CM | POA: Diagnosis not present

## 2017-07-25 DIAGNOSIS — M6281 Muscle weakness (generalized): Secondary | ICD-10-CM | POA: Diagnosis not present

## 2017-07-25 DIAGNOSIS — I071 Rheumatic tricuspid insufficiency: Secondary | ICD-10-CM | POA: Diagnosis not present

## 2017-07-25 DIAGNOSIS — I639 Cerebral infarction, unspecified: Secondary | ICD-10-CM | POA: Diagnosis not present

## 2017-07-25 DIAGNOSIS — E039 Hypothyroidism, unspecified: Secondary | ICD-10-CM | POA: Diagnosis not present

## 2017-07-25 DIAGNOSIS — D110 Benign neoplasm of parotid gland: Secondary | ICD-10-CM | POA: Diagnosis not present

## 2017-07-25 DIAGNOSIS — R1311 Dysphagia, oral phase: Secondary | ICD-10-CM | POA: Diagnosis not present

## 2017-07-25 DIAGNOSIS — I5030 Unspecified diastolic (congestive) heart failure: Secondary | ICD-10-CM | POA: Diagnosis not present

## 2017-07-25 DIAGNOSIS — L89890 Pressure ulcer of other site, unstageable: Secondary | ICD-10-CM | POA: Diagnosis not present

## 2017-07-25 DIAGNOSIS — K118 Other diseases of salivary glands: Secondary | ICD-10-CM | POA: Diagnosis not present

## 2017-07-25 DIAGNOSIS — I130 Hypertensive heart and chronic kidney disease with heart failure and stage 1 through stage 4 chronic kidney disease, or unspecified chronic kidney disease: Secondary | ICD-10-CM | POA: Diagnosis not present

## 2017-07-25 DIAGNOSIS — I69315 Cognitive social or emotional deficit following cerebral infarction: Secondary | ICD-10-CM | POA: Diagnosis not present

## 2017-07-29 DIAGNOSIS — K118 Other diseases of salivary glands: Secondary | ICD-10-CM | POA: Diagnosis not present

## 2017-07-29 DIAGNOSIS — L89620 Pressure ulcer of left heel, unstageable: Secondary | ICD-10-CM | POA: Diagnosis not present

## 2017-07-29 DIAGNOSIS — R05 Cough: Secondary | ICD-10-CM | POA: Diagnosis not present

## 2017-08-01 DIAGNOSIS — R4583 Excessive crying of child, adolescent or adult: Secondary | ICD-10-CM | POA: Diagnosis not present

## 2017-08-05 DIAGNOSIS — L89620 Pressure ulcer of left heel, unstageable: Secondary | ICD-10-CM | POA: Diagnosis not present

## 2017-08-06 DIAGNOSIS — I6990 Unspecified sequelae of unspecified cerebrovascular disease: Secondary | ICD-10-CM | POA: Diagnosis not present

## 2017-08-06 DIAGNOSIS — R400 Somnolence: Secondary | ICD-10-CM | POA: Diagnosis not present

## 2017-08-22 DIAGNOSIS — Z7409 Other reduced mobility: Secondary | ICD-10-CM | POA: Diagnosis not present

## 2017-08-22 DIAGNOSIS — R569 Unspecified convulsions: Secondary | ICD-10-CM | POA: Diagnosis not present

## 2017-08-22 DIAGNOSIS — L89151 Pressure ulcer of sacral region, stage 1: Secondary | ICD-10-CM | POA: Diagnosis not present

## 2017-08-22 DIAGNOSIS — E876 Hypokalemia: Secondary | ICD-10-CM | POA: Diagnosis not present

## 2017-08-22 DIAGNOSIS — R197 Diarrhea, unspecified: Secondary | ICD-10-CM | POA: Diagnosis not present

## 2017-08-22 DIAGNOSIS — E440 Moderate protein-calorie malnutrition: Secondary | ICD-10-CM | POA: Diagnosis not present

## 2017-08-22 DIAGNOSIS — Z888 Allergy status to other drugs, medicaments and biological substances status: Secondary | ICD-10-CM | POA: Diagnosis not present

## 2017-08-22 DIAGNOSIS — K119 Disease of salivary gland, unspecified: Secondary | ICD-10-CM | POA: Diagnosis not present

## 2017-08-22 DIAGNOSIS — R627 Adult failure to thrive: Secondary | ICD-10-CM | POA: Diagnosis not present

## 2017-08-22 DIAGNOSIS — Z79899 Other long term (current) drug therapy: Secondary | ICD-10-CM | POA: Diagnosis not present

## 2017-08-22 DIAGNOSIS — Z880 Allergy status to penicillin: Secondary | ICD-10-CM | POA: Diagnosis not present

## 2017-08-22 DIAGNOSIS — R5381 Other malaise: Secondary | ICD-10-CM | POA: Diagnosis not present

## 2017-08-22 DIAGNOSIS — Z66 Do not resuscitate: Secondary | ICD-10-CM | POA: Diagnosis not present

## 2017-08-22 DIAGNOSIS — I517 Cardiomegaly: Secondary | ICD-10-CM | POA: Diagnosis not present

## 2017-08-22 DIAGNOSIS — R531 Weakness: Secondary | ICD-10-CM | POA: Diagnosis not present

## 2017-08-22 DIAGNOSIS — R9082 White matter disease, unspecified: Secondary | ICD-10-CM | POA: Diagnosis not present

## 2017-08-22 DIAGNOSIS — A0472 Enterocolitis due to Clostridium difficile, not specified as recurrent: Secondary | ICD-10-CM | POA: Diagnosis not present

## 2017-08-22 DIAGNOSIS — Z8673 Personal history of transient ischemic attack (TIA), and cerebral infarction without residual deficits: Secondary | ICD-10-CM | POA: Diagnosis not present

## 2017-08-22 DIAGNOSIS — I639 Cerebral infarction, unspecified: Secondary | ICD-10-CM | POA: Diagnosis not present

## 2017-08-22 DIAGNOSIS — R918 Other nonspecific abnormal finding of lung field: Secondary | ICD-10-CM | POA: Diagnosis not present

## 2017-08-22 DIAGNOSIS — L89611 Pressure ulcer of right heel, stage 1: Secondary | ICD-10-CM | POA: Diagnosis not present

## 2017-08-22 DIAGNOSIS — Z6824 Body mass index (BMI) 24.0-24.9, adult: Secondary | ICD-10-CM | POA: Diagnosis not present

## 2017-08-22 DIAGNOSIS — L89152 Pressure ulcer of sacral region, stage 2: Secondary | ICD-10-CM | POA: Diagnosis not present

## 2017-09-24 DIAGNOSIS — I639 Cerebral infarction, unspecified: Secondary | ICD-10-CM | POA: Diagnosis not present

## 2017-10-31 DEATH — deceased

## 2017-11-05 ENCOUNTER — Encounter: Payer: Self-pay | Admitting: Nurse Practitioner

## 2017-12-08 ENCOUNTER — Other Ambulatory Visit: Payer: Self-pay | Admitting: Neurology

## 2017-12-10 ENCOUNTER — Other Ambulatory Visit: Payer: Self-pay | Admitting: Neurology

## 2017-12-19 ENCOUNTER — Other Ambulatory Visit: Payer: Self-pay | Admitting: Neurology

## 2017-12-22 ENCOUNTER — Ambulatory Visit: Payer: Medicare Other | Admitting: Nurse Practitioner

## 2018-01-01 ENCOUNTER — Other Ambulatory Visit: Payer: Self-pay | Admitting: Neurology

## 2018-02-09 ENCOUNTER — Telehealth: Payer: Self-pay | Admitting: Neurology

## 2018-02-09 NOTE — Telephone Encounter (Signed)
Pts daughter called to inform the practice that the pt has passed as of 10/31/2017

## 2018-02-09 NOTE — Telephone Encounter (Signed)
reviewed

## 2018-02-11 ENCOUNTER — Ambulatory Visit: Payer: Medicare Other | Admitting: Nurse Practitioner

## 2018-04-19 IMAGING — MR MR HEAD W/O CM
9 of 12 series · 30 of 48 positions shown · non-contrast
Comparison: Prior CT from earlier same day.

CLINICAL DATA: Initial evaluation for acute left-sided weakness,
facial droop.

EXAM:
MRI HEAD WITHOUT CONTRAST
MRA HEAD WITHOUT CONTRAST
TECHNIQUE: Multiplanar, multiecho pulse sequences of the brain and surrounding
structures were obtained without intravenous contrast. Angiographic
images of the head were obtained using MRA technique without
contrast.

[Series 4: DWI · axial · 3.0mm · 1.09mm/px · z∈[-32,+98]mm · 6 of 90 slices shown (1 of 4)]
[im 1/90]
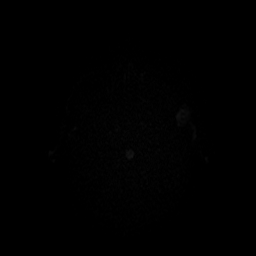
[im 18/90]
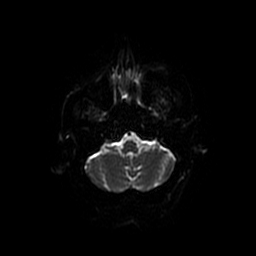
[im 36/90]
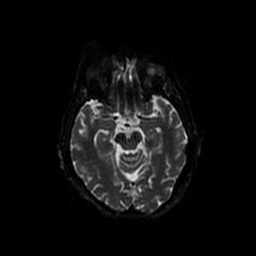
[im 54/90]
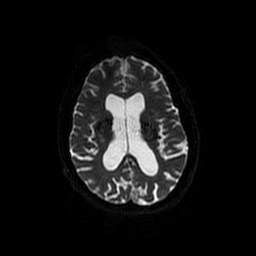
[im 72/90]
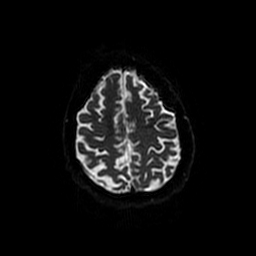
[im 90/90]
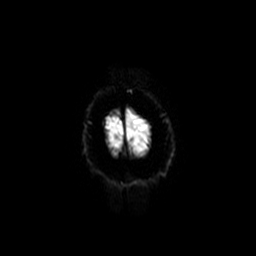

[Series 5: (id) mt fs · axial · 1.4mm · 0.43mm/px · z∈[-42,-5]mm · 4 of 136 slices shown]
[im 1/136]
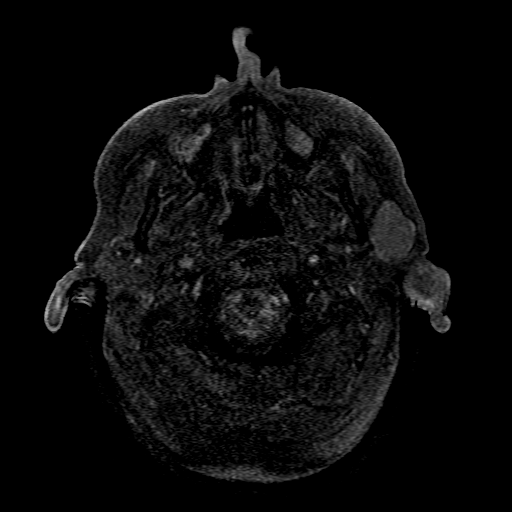
[im 28/136]
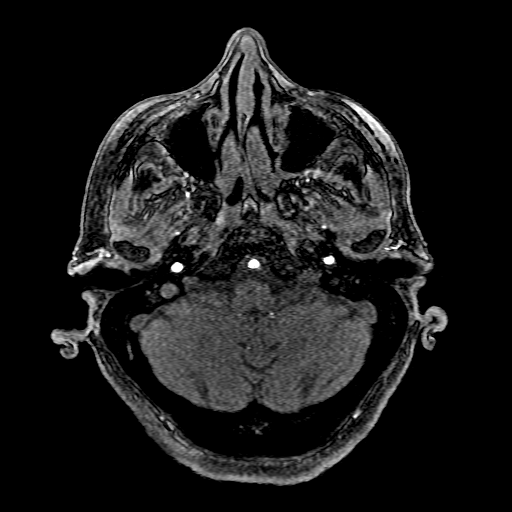
[im 41/136]
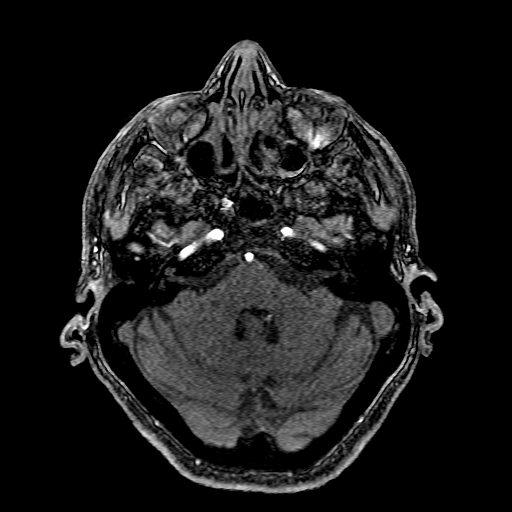
[im 55/136]
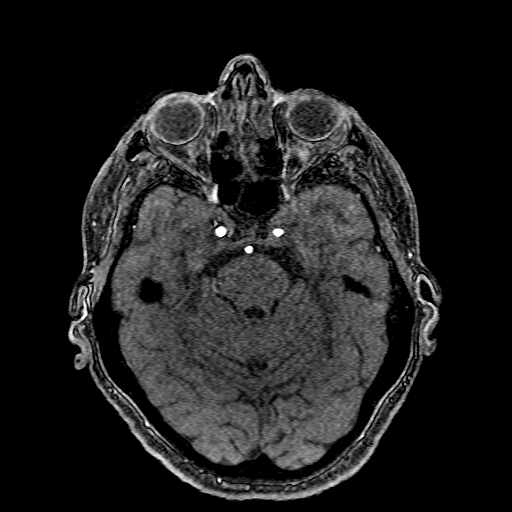

[Series 6: DWI · coronal · 5.0mm · 1.09mm/px · 5 of 66 slices shown (2 of 4)]
[im 1/66]
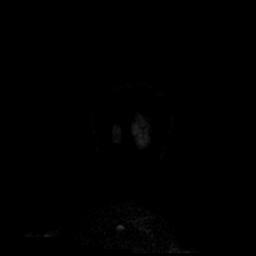
[im 17/66]
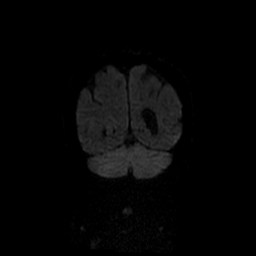
[im 33/66]
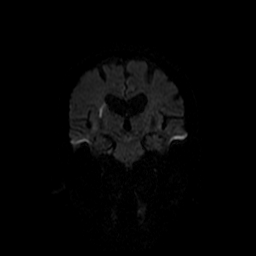
[im 49/66]
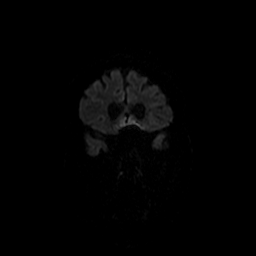
[im 66/66]
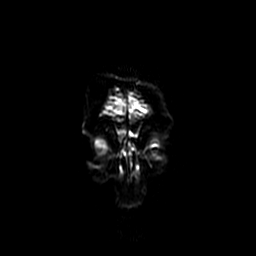

[Series 7: T2 · axial · 5.0mm · 0.47mm/px · z∈[-31,+98]mm · 2 of 23 slices shown (1 of 2)]
[im 1/23]
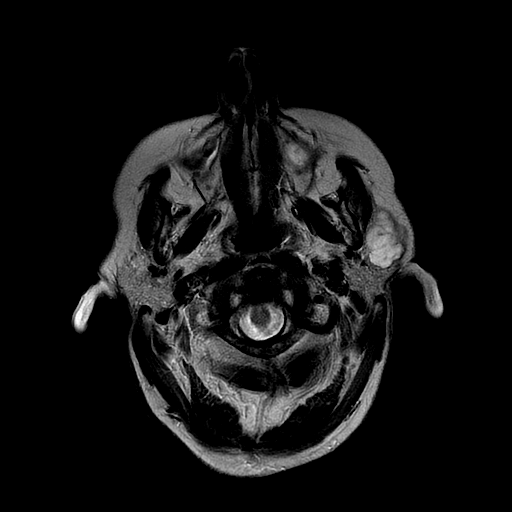
[im 23/23]
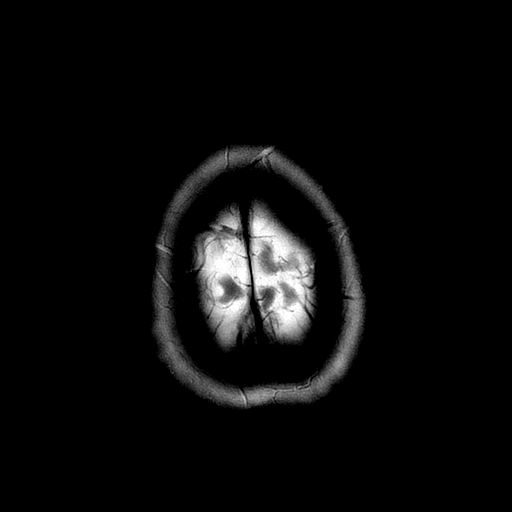

[Series 9: FLAIR · axial · 5.0mm · 0.47mm/px · z∈[-31,+98]mm · 2 of 23 slices shown]
[im 1/23]
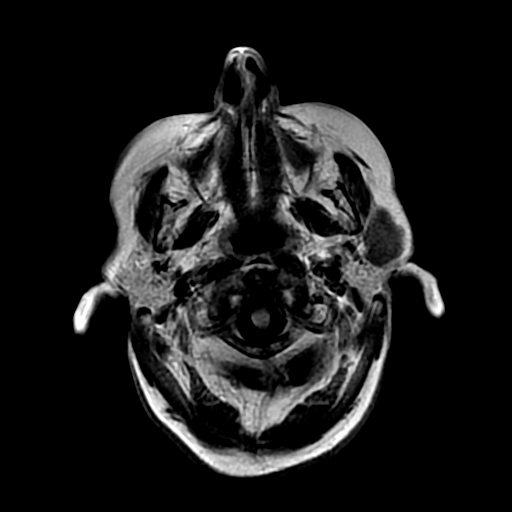
[im 23/23]
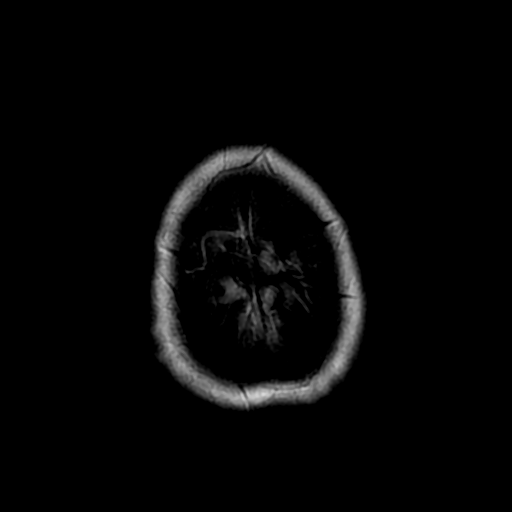

[Series 11: T1 · sagittal · 5.0mm · 0.47mm/px · 2 of 23 slices shown]
[im 1/23]
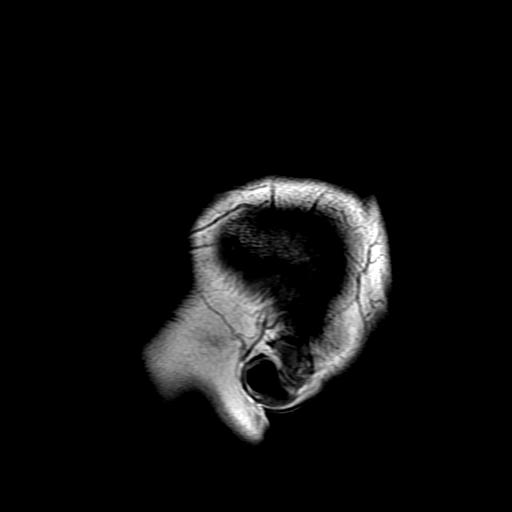
[im 23/23]
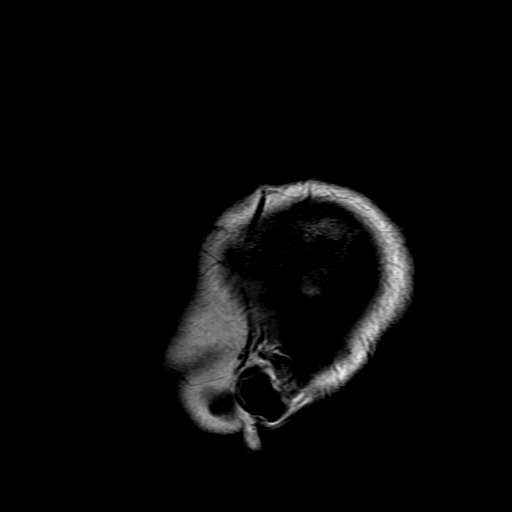

[Series 12: T2 · coronal · 5.0mm · 0.43mm/px · 2 of 28 slices shown (2 of 2)]
[im 1/28]
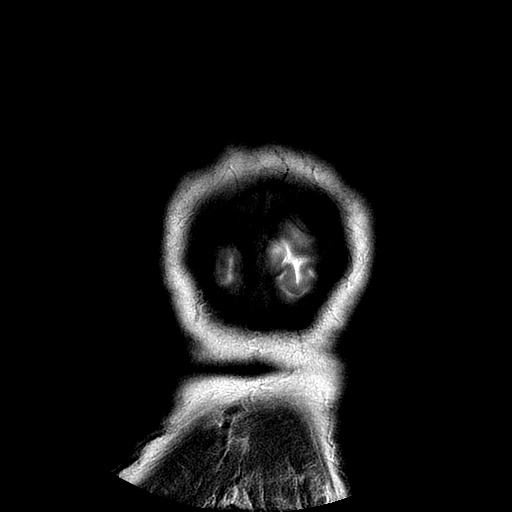
[im 28/28]
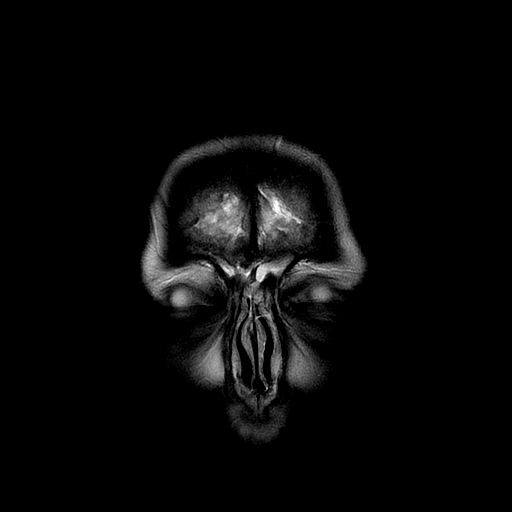

[Series 400: DWI · axial · 3.0mm · 1.09mm/px · z∈[-32,+98]mm · 4 of 45 slices shown (3 of 4)]
[im 1/45]
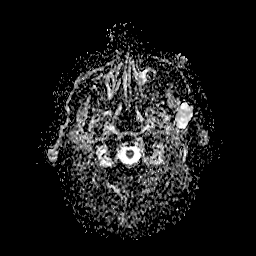
[im 15/45]
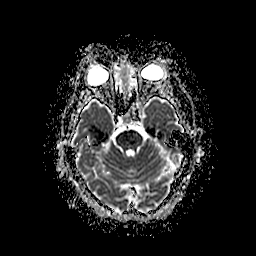
[im 30/45]
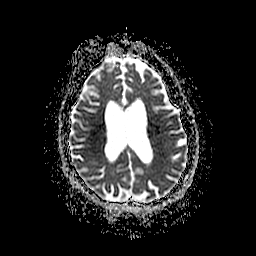
[im 45/45]
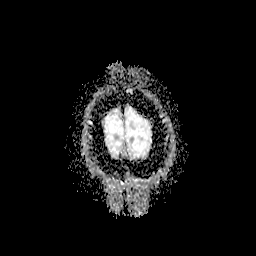

[Series 600: DWI · coronal · 5.0mm · 1.09mm/px · 3 of 33 slices shown (4 of 4)]
[im 1/33]
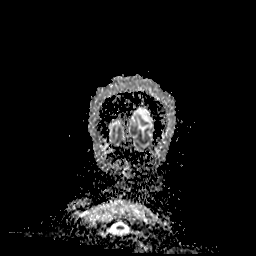
[im 17/33]
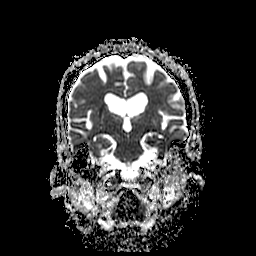
[im 33/33]
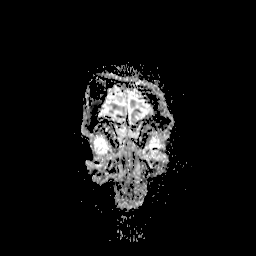

[30 of 48 positions shown; findings below may reference images not displayed]

FINDINGS: MRI HEAD FINDINGS

Brain: Diffuse prominence of the CSF containing spaces is compatible
with generalized age-related cerebral atrophy. No significant
cerebral white matter disease for patient age. Focal
encephalomalacia with gliosis within the left occipital lobe
compatible with remote infarct.

Small curvilinear focus of restricted diffusion extends from the
posterior aspect of the right lentiform nucleus towards the right
corona radiata, compatible with a small acute lacunar infarct
(series 4, image 28). Infarct measures 19 mm in length. No
associated hemorrhage or mass effect. No other evidence for acute or
subacute ischemia.

No mass lesion, midline shift, or mass effect. Mild ventricular
prominence related to global parenchymal volume loss without
hydrocephalus. No extra-axial fluid collection. Major dural sinuses
are grossly patent.

Pituitary gland within normal limits.

Vascular: Major intracranial vascular flow voids maintained.

Skull and upper cervical spine: Mild degenerative ligamentous
thickening noted at the craniocervical junction. Visualized upper
cervical spine otherwise unremarkable. Bone marrow signal intensity
within normal limits. No scalp soft tissue abnormality.

Sinuses/Orbits: Globes and oval soft tissues within normal limits.

Scattered mucosal thickening throughout the paranasal sinuses,
greatest within the ethmoid air cells. No mastoid effusion. Inner
ear structures normal.

Other: 2.8 cm well-circumscribed T2 hyperintense mass noted within
the left parotid gland, incompletely visualized.

MRA HEAD FINDINGS

ANTERIOR CIRCULATION:

Distal cervical segments of the internal carotid arteries are patent
with antegrade flow. Petrous, cavernous, supraclinoid segments
widely patent without flow-limiting stenosis. Dominant right A1
segment widely patent. Left A1 segment hypoplastic but patent. This
likely accounts for the slightly diminutive left ICA is compared to
the right. Anterior communicating artery normal. Anterior cerebral
arteries widely patent to their distal aspects. M1 segments widely
patent without stenosis or occlusion. Distal MCA branches well
opacified and symmetric.

POSTERIOR CIRCULATION:

Left vertebral artery dominant and widely patent to the
vertebrobasilar junction. Right vertebral artery hypoplastic but
grossly patent as well. Posterior inferior cerebral arteries patent
proximally. Basilar artery widely patent. Superior cerebellar and
posterior cerebral arteries widely patent bilaterally.

No aneurysm or vascular malformation.
IMPRESSION: MRI HEAD IMPRESSION:

1. 19 mm acute nonhemorrhagic lacunar infarct involving the right
basal ganglia. No associated mass effect.
2. Remote left occipital lobe infarct.
3. Generalized age-related cerebral atrophy.
4. 2.8 cm cystic left parotid lobe mass, indeterminate. Nonemergent
ENT consultation suggested.

MRA HEAD IMPRESSION:

Negative intracranial MRA. No large vessel occlusion. No high-grade
or correctable stenosis.
